# Patient Record
Sex: Female | Born: 1995 | Race: Black or African American | Hispanic: No | Marital: Single | Smoking: Never smoker
Health system: Southern US, Community
[De-identification: ages and names within clinical notes are randomized; demographics above are authoritative.]

## PROBLEM LIST (undated history)

## (undated) DIAGNOSIS — Z8041 Family history of malignant neoplasm of ovary: Secondary | ICD-10-CM

## (undated) DIAGNOSIS — Z803 Family history of malignant neoplasm of breast: Secondary | ICD-10-CM

## (undated) DIAGNOSIS — M329 Systemic lupus erythematosus, unspecified: Secondary | ICD-10-CM

## (undated) DIAGNOSIS — N63 Unspecified lump in unspecified breast: Secondary | ICD-10-CM

## (undated) DIAGNOSIS — D649 Anemia, unspecified: Secondary | ICD-10-CM

## (undated) DIAGNOSIS — Z8042 Family history of malignant neoplasm of prostate: Secondary | ICD-10-CM

## (undated) HISTORY — DX: Family history of malignant neoplasm of prostate: Z80.42

## (undated) HISTORY — DX: Family history of malignant neoplasm of ovary: Z80.41

## (undated) HISTORY — PX: NO PAST SURGERIES: SHX2092

## (undated) HISTORY — DX: Family history of malignant neoplasm of breast: Z80.3

---
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
## 2016-02-11 ENCOUNTER — Encounter (HOSPITAL_COMMUNITY): Payer: Self-pay

## 2016-02-11 ENCOUNTER — Inpatient Hospital Stay (HOSPITAL_COMMUNITY)
Admission: AD | Admit: 2016-02-11 | Discharge: 2016-02-11 | Disposition: A | Discharge status: Home / Self Care | Payer: Federal, State, Local not specified - PPO | Source: Ambulatory Visit | Attending: Family Medicine | Admitting: Family Medicine

## 2016-02-11 DIAGNOSIS — M329 Systemic lupus erythematosus, unspecified: Secondary | ICD-10-CM | POA: Diagnosis not present

## 2016-02-11 DIAGNOSIS — N939 Abnormal uterine and vaginal bleeding, unspecified: Secondary | ICD-10-CM | POA: Diagnosis not present

## 2016-02-11 DIAGNOSIS — D509 Iron deficiency anemia, unspecified: Secondary | ICD-10-CM | POA: Diagnosis not present

## 2016-02-11 HISTORY — DX: Anemia, unspecified: D64.9

## 2016-02-11 HISTORY — DX: Systemic lupus erythematosus, unspecified: M32.9

## 2016-02-11 LAB — URINALYSIS, ROUTINE W REFLEX MICROSCOPIC
BILIRUBIN URINE: NEGATIVE
Glucose, UA: NEGATIVE mg/dL
Ketones, ur: NEGATIVE mg/dL
LEUKOCYTES UA: NEGATIVE
NITRITE: NEGATIVE
PH: 7 (ref 5.0–8.0)
Protein, ur: 100 mg/dL — AB
SPECIFIC GRAVITY, URINE: 1.015 (ref 1.005–1.030)

## 2016-02-11 LAB — URINE MICROSCOPIC-ADD ON: WBC UA: NONE SEEN WBC/hpf (ref 0–5)

## 2016-02-11 LAB — CBC
HCT: 32.2 % — ABNORMAL LOW (ref 36.0–46.0)
Hemoglobin: 10.3 g/dL — ABNORMAL LOW (ref 12.0–15.0)
MCH: 24 pg — ABNORMAL LOW (ref 26.0–34.0)
MCHC: 32 g/dL (ref 30.0–36.0)
MCV: 74.9 fL — ABNORMAL LOW (ref 78.0–100.0)
PLATELETS: 144 10*3/uL — AB (ref 150–400)
RBC: 4.3 MIL/uL (ref 3.87–5.11)
RDW: 19.7 % — AB (ref 11.5–15.5)
WBC: 3.1 10*3/uL — AB (ref 4.0–10.5)

## 2016-02-11 LAB — POCT PREGNANCY, URINE: PREG TEST UR: NEGATIVE

## 2016-02-11 MED ORDER — MEDROXYPROGESTERONE ACETATE 10 MG PO TABS: 20.0000 mg | ORAL_TABLET | Freq: Every day | ORAL | Status: DC

## 2016-02-11 MED ORDER — HYDROXYCHLOROQUINE SULFATE 200 MG PO TABS: 300.0000 mg | ORAL_TABLET | Freq: Every day | ORAL | Status: DC

## 2016-02-11 MED ORDER — FERROUS SULFATE 325 (65 FE) MG PO TABS: 325.0000 mg | ORAL_TABLET | Freq: Two times a day (BID) | ORAL | Status: DC

## 2016-02-11 NOTE — Discharge Instructions (Signed)
Abnormal Uterine Bleeding °Abnormal uterine bleeding can affect women at various stages in life, including teenagers, women in their reproductive years, pregnant women, and women who have reached menopause. Several kinds of uterine bleeding are considered abnormal, including: °· Bleeding or spotting between periods.   °· Bleeding after sexual intercourse.   °· Bleeding that is heavier or more than normal.   °· Periods that last longer than usual. °· Bleeding after menopause.   °Many cases of abnormal uterine bleeding are minor and simple to treat, while others are more serious. Any type of abnormal bleeding should be evaluated by your health care provider. Treatment will depend on the cause of the bleeding. °HOME CARE INSTRUCTIONS °Monitor your condition for any changes. The following actions may help to alleviate any discomfort you are experiencing: °· Avoid the use of tampons and douches as directed by your health care provider. °· Change your pads frequently. °You should get regular pelvic exams and Pap tests. Keep all follow-up appointments for diagnostic tests as directed by your health care provider.  °SEEK MEDICAL CARE IF:  °· Your bleeding lasts more than 1 week.   °· You feel dizzy at times.   °SEEK IMMEDIATE MEDICAL CARE IF:  °· You pass out.   °· You are changing pads every 15 to 30 minutes.   °· You have abdominal pain. °· You have a fever.   °· You become sweaty or weak.   °· You are passing large blood clots from the vagina.   °· You start to feel nauseous and vomit. °MAKE SURE YOU:  °· Understand these instructions. °· Will watch your condition. °· Will get help right away if you are not doing well or get worse. °  °This information is not intended to replace advice given to you by your health care provider. Make sure you discuss any questions you have with your health care provider. °  °Document Released: 09/29/2005 Document Revised: 10/04/2013 Document Reviewed: 04/28/2013 °Elsevier Interactive  Patient Education ©2016 Elsevier Inc. °Anemia, Nonspecific °Anemia is a condition in which the concentration of red blood cells or hemoglobin in the blood is below normal. Hemoglobin is a substance in red blood cells that carries oxygen to the tissues of the body. Anemia results in not enough oxygen reaching these tissues.  °CAUSES  °Common causes of anemia include:  °· Excessive bleeding. Bleeding may be internal or external. This includes excessive bleeding from periods (in women) or from the intestine.   °· Poor nutrition.   °· Chronic kidney, thyroid, and liver disease.  °· Bone marrow disorders that decrease red blood cell production. °· Cancer and treatments for cancer. °· HIV, AIDS, and their treatments. °· Spleen problems that increase red blood cell destruction. °· Blood disorders. °· Excess destruction of red blood cells due to infection, medicines, and autoimmune disorders. °SIGNS AND SYMPTOMS  °· Minor weakness.   °· Dizziness.   °· Headache. °· Palpitations.   °· Shortness of breath, especially with exercise.   °· Paleness. °· Cold sensitivity. °· Indigestion. °· Nausea. °· Difficulty sleeping. °· Difficulty concentrating. °Symptoms may occur suddenly or they may develop slowly.  °DIAGNOSIS  °Additional blood tests are often needed. These help your health care provider determine the best treatment. Your health care provider will check your stool for blood and look for other causes of blood loss.  °TREATMENT  °Treatment varies depending on the cause of the anemia. Treatment can include:  °· Supplements of iron, vitamin B12, or folic acid.   °· Hormone medicines.   °· A blood transfusion. This may be needed if blood loss is   severe.   °· Hospitalization. This may be needed if there is significant continual blood loss.   °· Dietary changes. °· Spleen removal. °HOME CARE INSTRUCTIONS °Keep all follow-up appointments. It often takes many weeks to correct anemia, and having your health care provider check on  your condition and your response to treatment is very important. °SEEK IMMEDIATE MEDICAL CARE IF:  °· You develop extreme weakness, shortness of breath, or chest pain.   °· You become dizzy or have trouble concentrating. °· You develop heavy vaginal bleeding.   °· You develop a rash.   °· You have bloody or black, tarry stools.   °· You faint.   °· You vomit up blood.   °· You vomit repeatedly.   °· You have abdominal pain. °· You have a fever or persistent symptoms for more than 2-3 days.   °· You have a fever and your symptoms suddenly get worse.   °· You are dehydrated.   °MAKE SURE YOU: °· Understand these instructions. °· Will watch your condition. °· Will get help right away if you are not doing well or get worse. °  °This information is not intended to replace advice given to you by your health care provider. Make sure you discuss any questions you have with your health care provider. °  °Document Released: 11/06/2004 Document Revised: 06/01/2013 Document Reviewed: 03/25/2013 °Elsevier Interactive Patient Education ©2016 Elsevier Inc. ° °

## 2016-02-11 NOTE — MAU Provider Note (Signed)
History     CSN: [id number]  Arrival date and time: 02/11/16 1726   First Provider Initiated Contact with Patient 02/11/16 1926      Chief Complaint  Patient presents with  . Vaginal Bleeding   HPI   Ms.Martha Valentine is a 20 y.o. female G0P0000 with a history of anemia and lupus presents to MAU with concerns about irregular vaginal bleeding. She started her period on 4/6 which was a regular time for her menstrual cycle to start. She has not stopped bleeding since 4/6. The bleeding is described as heavy; it started out as light and picked up to heavy today.  No blood clots noted. She has changed her pad 2-3 times today.   She takes iron sporadically; cannot remember when the last time she had.   Patient is not sexually active currently . She has never had a sexual transmitted disease.   OB History    Gravida Para Term Preterm AB TAB SAB Ectopic Multiple Living   0 0 0 0 0 0 0 0 0 0       Past Medical History  Diagnosis Date  . Lupus (systemic lupus erythematosus) (HCC)   . Anemia     Past Surgical History  Procedure Laterality Date  . No past surgeries      No family history on file.  Social History  Substance Use Topics  . Smoking status: Never Smoker   . Smokeless tobacco: None  . Alcohol Use: No    Allergies: No Known Allergies  Prescriptions prior to admission  Medication Sig Dispense Refill Last Dose  . ferrous sulfate 325 (65 FE) MG tablet Take 325 mg by mouth daily with breakfast.   Past Month at Unknown time    Results for orders placed or performed during the hospital encounter of 02/11/16 (from the past 72 hour(s))  Urinalysis, Routine w reflex microscopic (not [facility])     Status: Abnormal   Collection Time: 02/11/16  6:09 PM  Result Value Ref Range   Color, Urine RED (A) YELLOW    Comment: BIOCHEMICALS MAY BE AFFECTED BY COLOR   APPearance CLEAR CLEAR   Specific Gravity, Urine 1.015 1.005 - 1.030   pH 7.0 5.0 - 8.0   Glucose, UA NEGATIVE  NEGATIVE mg/dL   Hgb urine dipstick LARGE (A) NEGATIVE   Bilirubin Urine NEGATIVE NEGATIVE   Ketones, ur NEGATIVE NEGATIVE mg/dL   Protein, ur 130100 (A) NEGATIVE mg/dL   Nitrite NEGATIVE NEGATIVE   Leukocytes, UA NEGATIVE NEGATIVE  Urine microscopic-add on     Status: Abnormal   Collection Time: 02/11/16  6:09 PM  Result Value Ref Range   Squamous Epithelial / LPF 6-30 (A) NONE SEEN   WBC, UA NONE SEEN 0 - 5 WBC/hpf   RBC / HPF TOO NUMEROUS TO COUNT 0 - 5 RBC/hpf   Bacteria, UA MANY (A) NONE SEEN  Pregnancy, urine POC     Status: None   Collection Time: 02/11/16  6:28 PM  Result Value Ref Range   Preg Test, Ur NEGATIVE NEGATIVE    Comment:        THE SENSITIVITY OF THIS METHODOLOGY IS >24 mIU/mL   CBC     Status: Abnormal   Collection Time: 02/11/16  7:06 PM  Result Value Ref Range   WBC 3.1 (L) 4.0 - 10.5 K/uL   RBC 4.30 3.87 - 5.11 MIL/uL   Hemoglobin 10.3 (L) 12.0 - 15.0 g/dL   HCT 86.532.2 (L) 78.436.0 -  46.0 %   MCV 74.9 (L) 78.0 - 100.0 fL   MCH 24.0 (L) 26.0 - 34.0 pg   MCHC 32.0 30.0 - 36.0 g/dL   RDW 09.8 (H) 11.9 - 14.7 %   Platelets 144 (L) 150 - 400 K/uL     Review of Systems  Gastrointestinal: Negative for abdominal pain.  Genitourinary: Negative for dysuria.  Neurological: Negative for dizziness.   Physical Exam   Blood pressure 134/80, pulse 93, temperature 98.2 F (36.8 C), resp. rate 18, height  (1.727 m), weight 123 lb (55.792 kg), last menstrual period 01/17/2016.  Physical Exam  Constitutional: She is oriented to person, place, and time. She appears well-developed and well-nourished. No distress.  HENT:  Head: Normocephalic.  Eyes: Pupils are equal, round, and reactive to light.  Respiratory: Effort normal.  GI: Soft.  Genitourinary:  Speculum exam deferred Cervix closed, no CMT  Uterus non tender, normal size Adnexa non tender, no masses bilaterally Chaperone present for exam.  Musculoskeletal: Normal range of motion.  Neurological: She is  alert and oriented to person, place, and time.  Skin: Skin is warm. She is not diaphoretic.  Psychiatric: Her behavior is normal.    MAU Course  Procedures  None  MDM CBC Antiphospholipid panel drawn  Discussed patient with Dr. Adrian Blackwater regarding hormonal therapy for bleeding.   Assessment and Plan   A:  1. Abnormal vaginal bleeding   2. Anemia, iron deficiency      P:  Discharge home in stable condition Follow up in the woc in 1-2 weeks for re-evaluation.  RX: Provera, Plaquenil, Iron.  Return to MAU if symptoms worsen  Bleeding precautions.    Duane Lope, NP 02/14/2016 9:04 AM

## 2016-02-11 NOTE — MAU Note (Signed)
Pt states that she started her period on 01/17/2016-has not stopped. Denies pain. States that she changes her pad 4-5 times/day on average. Today has changed her pad 2-3 times. States that this is unusual for her periods.

## 2016-02-11 NOTE — MAU Note (Signed)
States has had her period for 3 weeks. No pain. Denies hx of irreg periods or bleeding.

## 2016-02-14 LAB — ANTIPHOSPHOLIPID SYNDROME EVAL, BLD
Anticardiolipin IgG: <9 GPL U/mL (ref 0–14)
Anticardiolipin IgM: <9 MPL U/mL (ref 0–12)
DRVVT: 38.5 s (ref 0.0–47.0)
PTT Lupus Anticoagulant: 38.4 s (ref 0.0–43.6)
Phosphatydalserine, IgA: 2 APS IgA (ref 0–20)
Phosphatydalserine, IgG: 4 GPS IgG (ref 0–11)
Phosphatydalserine, IgM: 10 MPS IgM (ref 0–25)

## 2016-02-18 ENCOUNTER — Encounter: Payer: Federal, State, Local not specified - PPO | Admitting: Obstetrics & Gynecology

## 2016-12-17 ENCOUNTER — Other Ambulatory Visit: Payer: Self-pay | Admitting: Obstetrics and Gynecology

## 2016-12-17 DIAGNOSIS — N63 Unspecified lump in unspecified breast: Secondary | ICD-10-CM

## 2016-12-19 ENCOUNTER — Ambulatory Visit
Admission: RE | Admit: 2016-12-19 | Discharge: 2016-12-19 | Disposition: A | Discharge status: Home / Self Care | Payer: Federal, State, Local not specified - PPO | Source: Ambulatory Visit | Attending: Obstetrics and Gynecology | Admitting: Obstetrics and Gynecology

## 2016-12-19 DIAGNOSIS — N63 Unspecified lump in unspecified breast: Secondary | ICD-10-CM

## 2016-12-19 HISTORY — DX: Unspecified lump in unspecified breast: N63.0

## 2017-05-08 ENCOUNTER — Telehealth: Payer: Self-pay | Admitting: Genetics

## 2017-05-08 ENCOUNTER — Encounter: Payer: Self-pay | Admitting: Genetics

## 2017-05-08 NOTE — Telephone Encounter (Signed)
Genetic counseling appt has been scheduled for the pt to see Lillia AbedLindsay on 8/13 at 1pm. Pt aware to arrive 15 minutes early. Address and insurance verified. Letter mailed to the pt and faxed to the referring.

## 2017-05-25 ENCOUNTER — Ambulatory Visit (HOSPITAL_BASED_OUTPATIENT_CLINIC_OR_DEPARTMENT_OTHER): Payer: Federal, State, Local not specified - PPO | Admitting: Genetics

## 2017-05-25 ENCOUNTER — Other Ambulatory Visit: Payer: Federal, State, Local not specified - PPO

## 2017-05-25 ENCOUNTER — Encounter: Payer: Self-pay | Admitting: Genetics

## 2017-05-25 DIAGNOSIS — Z803 Family history of malignant neoplasm of breast: Secondary | ICD-10-CM

## 2017-05-25 DIAGNOSIS — Z8041 Family history of malignant neoplasm of ovary: Secondary | ICD-10-CM

## 2017-05-25 DIAGNOSIS — Z8042 Family history of malignant neoplasm of prostate: Secondary | ICD-10-CM

## 2017-05-25 NOTE — Progress Notes (Signed)
REFERRING PROVIDER: Tyson Dense, [address] [place], [place] [address]  PRIMARY PROVIDER:  Jerene Pitch, MD   PRIMARY REASON FOR VISIT:  1. Family history of breast cancer   2. Family history of ovarian cancer   3. Family history of prostate cancer     HISTORY OF PRESENT ILLNESS:   Martha Valentine, a 21 y.o. female, was seen for a [place] cancer genetics consultation at the request of Dr. Emeline Gins due to a family history of cancer.  Martha Valentine presents to clinic today to discuss the possibility of a hereditary predisposition to cancer, genetic testing, and to further clarify her future cancer risks, as well as potential cancer risks for family members.   Martha Valentine is a 21 y.o. female with no personal history of cancer.  She has nodule in her breast that is being followed.   HORMONAL RISK FACTORS:  Menarche was at age 89.  First live birth at age N/A.  Ovaries intact: yes.  Hysterectomy: no.  Menopausal status: premenopausal.  HRT use: 0 years. Colonoscopy: no; not examined. Number of breast biopsies: 0.  Past Medical History:  Diagnosis Date  . Anemia   . Breast mass 12/19/2016   Mass upper inner quad X 2 weeks   . Family history of breast cancer   . Family history of ovarian cancer   . Family history of prostate cancer   . Lupus (systemic lupus erythematosus) ([place])     Past Surgical History:  Procedure Laterality Date  . NO PAST SURGERIES      Social History   Social History  . Marital status: Single    Spouse name: N/A  . Number of children: N/A  . Years of education: N/A   Social History Main Topics  . Smoking status: Never Smoker  . Smokeless tobacco: None  . Alcohol use No  . Drug use: No  . Sexual activity: Not Asked   Other Topics Concern  . None   Social History Narrative  . None     FAMILY HISTORY:  We obtained a detailed, 4-generation family history.  Significant diagnoses are listed below: Family History    Problem Relation Age of Onset  . Breast cancer Mother 26  . Other Father 63       Brain Tumor  . Lung cancer Maternal Aunt 41  . Prostate cancer Maternal Grandfather 68       required radiation therapy  . Ovarian cancer Other 78  . Ovarian cancer Other 47   Martha Valentine has a half sister who is 46 with no history of cancer. This sister has 3 daughters and a son with no history of cancer.  Martha Valentine has a 46 year-old brother with no history of cancer or any children.  Martha Valentine has a 58 year-old brother who has 2 sons ages 17 and 70 with no history of cancer.  Martha Valentine has a 51 year-old brother who has a son and a daughter (ages 47 and 70) with no history of cancer.   Martha Valentine father is 44 and has had a benign brain tumor since his 25's.  Martha Valentine has 1 paternal aunt and 2 paternal half-uncles listed below: -1 paternal aunt is 38 and has no history of cancer.  She has 2 daughters and 2 sons who are in their 78's/40's with no known history of cancer.   -1 paternal half-uncle died in his 73's.  No other information is known  about him.  -1 paternal half-uncle is 79 and has no history of cancer.  He has 4 daughters, not much information is known about these relatives.   Martha Valentine paternal grandfather died in his 51's, no information is known about him or his family.  Martha Valentine paternal grandmother is 14 and has no history of cancer.  Martha Valentine mother was diagnosed with breast cancer at 2 and is currently 1.  She had genetic testing in 2016 for the [facility] Breast/Ovarian cancer panel (21 genes) that was negative.  Martha Valentine has 3 maternal uncles and 1 maternal aunt listed below: -1 maternal uncle is 24 with no history of cancer or any children.  -1 maternal uncle is 74 with no history of cancer or any children.  -1 maternal uncle is 13 and has 3 daughters and a son.  No history of cancer for these relatives.  -1 maternal aunt died at 79 due to lung cancer.  She had 2  daughtesr (ages 42 and 9) and a son (37) with no history of cancer.  Martha Valentine maternal grandfather is 24 and was diagnosed with prostate cancer in his late 38's.  He was treated with radiation.  This grandfather had a sister who died of ovarian cancer at 46. This great-aunt had a daughter who died from ovarian cancer at 64, a daughter who had breast cancer in her late 32's, and a son who died from prostate cancer at 50.  Martha Valentine maternal grandfather also had a sister who might have had cancer in her late 94's.  He has a brother who died in his late 61's.  No much information is known about these relatives.    Martha Valentine maternal grandmother is 17 with no known history of cancer. This grandmother had a sister who died from ovarian cancer in her 81's.  This grandmother had 3 other sisters and a brother who might have had cancer.  Limited information is known about Martha Valentine's maternal grandmother's family history.    Martha Valentine is unaware of previous family history of genetic testing for hereditary cancer risks. Patient's maternal ancestors are of African American/native American descent, and paternal ancestors are of African American/Native American descent. There is no reported Ashkenazi Jewish ancestry. There is no known consanguinity.  GENETIC COUNSELING ASSESSMENT: Martha Valentine is a 21 y.o. female with a family history which is somewhat suggestive of a Hereditary Cancer Predisposition Syndrome. We, therefore, discussed and recommended the following at today's visit.   DISCUSSION: We reviewed the characteristics, features and inheritance patterns of hereditary cancer syndromes. We also discussed genetic testing, including the appropriate family members to test, the process of testing, insurance coverage and turn-around-time for results. We discussed the implications of a negative, positive and/or variant of uncertain significant result.   We discussed that only 5-10% of cancers are  associated with a Hereditary cancer predisposition syndrome.  One of the most common hereditary cancer syndromes that increases breast and ovarian cancer risk is called Hereditary Breast and Ovarian Cancer (HBOC) syndrome.  This syndrome is caused by mutations in the BRCA1 and BRCA2 genes.  This syndrome increases an individual's lifetime risk to develop breast, ovarian, pancreatic, and other types of cancer.  There are also many other cancer predisposition syndromes caused by mutations in several other genes.  We discussed that if she is found to have a mutation in one of these genes, it may impact future medical management recommendations such as increased cancer screenings and consideration  of risk reducing surgeries.  A positive result could also have implications for the patient's family members.  A Negative result would mean we were unable to identify a hereditary component to her cancer, but does not rule out the possibility of a hereditary basis for her cancer.  There could be mutations that are undetectable by current technology, or in genes not yet tested or identified to increase cancer risk.    We discussed the potential to find a Variant of Uncertain Significance or VUS.  These are variants that have not yet been identified as pathogenic or benign, and it is unknown if this variant is associated with increased cancer risk or if this is a normal finding.  Most VUS's are reclassified to benign or likely benign.   It should not be used to make medical management decisions. With time, we suspect the lab will determine the significance of any VUS's identified if any.   Because Ms. Mossa's mother already had genetic testing that was negative, we did not recommend genetic testing for Ms. Nelon at this time.  We discussed that insurance may not cover the test because her mother has already tested negative, but that if she wished to pursue genetic testing the patient pay price would be $250.  Marland Kitchen     We discussed that while her family history is suggestive of a genetic predisposition to cancer, if genetic testing did not reveal a mutation in her mother, it would be very unlikely for genetic testing to find a mutation that explains the family history of cancer in her.  We recommended that Ms. Beidler's maternal grandmother and maternal grandfather have genetic counseling and genetic testing.  Additionally, Ms. Hawkey maternal cousins once removed and 2nd cousins (related to the great aunt and cousins once removed with ovarian cancer) are all recommended to have genetic testing.  Ms. Kozub will let us know if we can be of assistance in coordinating this testing.    Based on the patient's personal and family history and the statistical model Lilian Kapur,  was used to estimate her risk of developing breast cacner. These estimate her lifetime risk of developing breast cancer to be approximately 25.6%%. The patient's lifetime breast cancer risk is a preliminary estimate based on available information using one of several models endorsed by the [employer] (ACS). The ACS recommends consideration of breast MRI screening as an adjunct to mammography for patients at high risk (defined as 20% or greater lifetime risk). A more detailed breast cancer risk assessment can be considered, if clinically indicated.   Ms. Allender has been determined to be at high risk for breast cancer.  Therefore, we recommend that annual screening with mammography and breast MRI begin at age 70, or 10 years prior to the age of breast cancer diagnosis in a relative (whichever is earlier).  We discussed that Ms. Flansburg should discuss her individual situation with her referring physician and determine a breast cancer screening plan with which they are both comfortable.      We discussed that some people do not want to undergo genetic testing due to fear of genetic discrimination.  A federal law called the Genetic  Information Non-Discrimination Act (GINA) of 2008 helps protect individuals against genetic discrimination based on their genetic test results.  It impacts both health insurance and employment.  With health insurance, it protects against increased premiums, being kicked off insurance or being forced to take a test in order to be insured.  For employment  it protects against hiring, firing and promoting decisions based on genetic test results.  Health status due to a cancer diagnosis is not protected under GINA.We discussed that some people do not want to undergo genetic testing due to fear of genetic discrimination.  A federal law called the Genetic Information Non-Discrimination Act (GINA) of 2008 helps protect individuals against genetic discrimination based on their genetic test results.  It impacts both health insurance and employment.  With health insurance, it protects against increased premiums, being kicked off insurance or being forced to take a test in order to be insured.  For employment it protects against hiring, firing and promoting decisions based on genetic test results.  Health status due to a cancer diagnosis is not protected under GINA.  PLAN: After considering the risks, benefits, and limitations, Ms. Haymaker has decided to decline testing at this time.  She may discuss genetic testing with her maternal grandparents and other maternal relatives first.  These relatives genetic test results will be more informative in assessing the family history of cancer.  Ms. Roam will let us know if we can be of help in coordinating this testing.   We encouraged Ms. Michel to remain in contact with cancer genetics annually so that we can continuously update the family history and inform her of any changes in cancer genetics and testing that may be of benefit for her family. Ms. Mom questions were answered to her satisfaction today. Our contact information was provided should additional questions or  concerns arise. We; therefore, recommend Ms. Gwynn continue to follow the cancer screening guidelines given by her primary and other healthcare providers.  Based on Ms. Freese's family history, we recommended her maternal relatives, who are related to her maternal great aunts with ovarian cancer, have genetic counseling and testing. Ms. Zertuche will let us know if we can be of any assistance in coordinating genetic counseling and/or testing for this family member.   Lastly, we encouraged Ms. Lada to remain in contact with cancer genetics annually so that we can continuously update the family history and inform her of any changes in cancer genetics and testing that may be of benefit for this family.   Ms.  Parlin questions were answered to her satisfaction today. Our contact information was provided should additional questions or concerns arise. Thank you for the referral and allowing [place] to share in the care of your patient.   Tana Felts, MS Genetic Counselor [email or web address] phone: [phone]  The patient was seen for a total of 60 minutes in face-to-face genetic counseling.  The patient was accompanied today by her mother, Corliss Skains, and her 2 nephews.

## 2017-06-15 ENCOUNTER — Encounter (HOSPITAL_COMMUNITY): Payer: Self-pay | Admitting: *Deleted

## 2017-06-15 ENCOUNTER — Emergency Department (HOSPITAL_COMMUNITY)
Admission: EM | Admit: 2017-06-15 | Discharge: 2017-06-15 | Disposition: A | Discharge status: Home / Self Care | Payer: Federal, State, Local not specified - PPO | Attending: Emergency Medicine | Admitting: Emergency Medicine

## 2017-06-15 DIAGNOSIS — Z79899 Other long term (current) drug therapy: Secondary | ICD-10-CM | POA: Insufficient documentation

## 2017-06-15 DIAGNOSIS — R55 Syncope and collapse: Secondary | ICD-10-CM | POA: Diagnosis present

## 2017-06-15 DIAGNOSIS — D509 Iron deficiency anemia, unspecified: Secondary | ICD-10-CM | POA: Diagnosis not present

## 2017-06-15 LAB — CBG MONITORING, ED: GLUCOSE-CAPILLARY: 88 mg/dL (ref 65–99)

## 2017-06-15 LAB — BASIC METABOLIC PANEL
Anion gap: 5 (ref 5–15)
BUN: 9 mg/dL (ref 6–20)
CHLORIDE: 106 mmol/L (ref 101–111)
CO2: 27 mmol/L (ref 22–32)
CREATININE: 0.72 mg/dL (ref 0.44–1.00)
Calcium: 9.5 mg/dL (ref 8.9–10.3)
GFR calc Af Amer: >60 mL/min (ref >60)
GFR calc non Af Amer: >60 mL/min (ref >60)
GLUCOSE: 95 mg/dL (ref 65–99)
Potassium: 4.2 mmol/L (ref 3.5–5.1)
Sodium: 138 mmol/L (ref 135–145)

## 2017-06-15 LAB — URINALYSIS, ROUTINE W REFLEX MICROSCOPIC
BILIRUBIN URINE: NEGATIVE
GLUCOSE, UA: NEGATIVE mg/dL
HGB URINE DIPSTICK: NEGATIVE
KETONES UR: NEGATIVE mg/dL
Nitrite: NEGATIVE
PROTEIN: NEGATIVE mg/dL
Specific Gravity, Urine: 1.01 (ref 1.005–1.030)
pH: 8 (ref 5.0–8.0)

## 2017-06-15 LAB — CBC
HCT: 30.2 % — ABNORMAL LOW (ref 36.0–46.0)
Hemoglobin: 9.4 g/dL — ABNORMAL LOW (ref 12.0–15.0)
MCH: 22.6 pg — AB (ref 26.0–34.0)
MCHC: 31.1 g/dL (ref 30.0–36.0)
MCV: 72.6 fL — AB (ref 78.0–100.0)
PLATELETS: 199 10*3/uL (ref 150–400)
RBC: 4.16 MIL/uL (ref 3.87–5.11)
RDW: 19.2 % — AB (ref 11.5–15.5)
WBC: 5 10*3/uL (ref 4.0–10.5)

## 2017-06-15 LAB — I-STAT BETA HCG BLOOD, ED (MC, WL, AP ONLY)

## 2017-06-15 MED ORDER — SODIUM CHLORIDE 0.9 % IV BOLUS (SEPSIS)
1000.0000 mL | Freq: Once | INTRAVENOUS | Status: AC
  Administered 2017-06-15: 1000 mL via INTRAVENOUS

## 2017-06-15 MED ORDER — FLUCONAZOLE 200 MG PO TABS
200.0000 mg | ORAL_TABLET | Freq: Once | ORAL | Status: AC
  Administered 2017-06-15: 200 mg via ORAL
  Filled 2017-06-15: qty 1

## 2017-06-15 NOTE — ED Triage Notes (Signed)
Pt states she was a little upset, lying down then felt like she could not breath, friend with her. Pt with only c/o headache in triage, no SHOB or other symptoms noted

## 2017-06-15 NOTE — ED Triage Notes (Signed)
Pt BIB [facility] after having a near syncopal episode today. Hx of lupus. Alert and oriented. VSS.

## 2017-06-15 NOTE — ED Provider Notes (Addendum)
[facility] Provider Note   CSN: [id number] Arrival date & time: 06/15/17  1717     History   Chief Complaint Chief Complaint  Patient presents with  . Near Syncope    HPI Martha Valentine is a 21 y.o. female.  Patient is a 21 year old female with a history of recently diagnosed lupus who presents with a near syncopal episode.she states that she was lying on the bed and had a sudden onset of dizziness with some shortness of breath and palpitations. She felt like she was going to pass out but did not pass out. She's had no recent illnesses. No fevers coughing or cold symptoms. No urinary symptoms. She currently feels fatigued and has a bifrontal headache but denies any ongoing symptoms. She's had similar symptoms 4-5 times in the past. She's had an extensive evaluation which was when she was diagnosed with the lupus. She has been seen by cardiology and had a Holter monitor placed which she says was unremarkable.      Past Medical History:  Diagnosis Date  . Anemia   . Breast mass 12/19/2016   Mass upper inner quad X 2 weeks   . Family history of breast cancer   . Family history of ovarian cancer   . Family history of prostate cancer   . Lupus (systemic lupus erythematosus) (HCC)     Patient Active Problem List   Diagnosis Date Noted  . Family history of breast cancer 05/25/2017  . Family history of ovarian cancer 05/25/2017  . Family history of prostate cancer 05/25/2017    Past Surgical History:  Procedure Laterality Date  . NO PAST SURGERIES      OB History    Gravida Para Term Preterm AB Living   0 0 0 0 0 0   SAB TAB Ectopic Multiple Live Births   0 0 0 0         Home Medications    Prior to Admission medications   Medication Sig Start Date End Date Taking? Authorizing Provider  hydroxychloroquine (PLAQUENIL) 200 MG tablet Take 1.5 tablets (300 mg total) by mouth daily. Patient taking differently: Take 200 mg by mouth daily.  02/11/16  Yes Rasch,  Harolyn Rutherford, NP  ibuprofen (ADVIL,MOTRIN) 200 MG tablet Take 200 mg by mouth every 6 (six) hours as needed for fever, headache, mild pain, moderate pain or cramping.   Yes [provider]  ferrous sulfate 325 (65 FE) MG tablet Take 1 tablet (325 mg total) by mouth 2 (two) times daily with a meal. Patient not taking: Reported on 06/15/2017 02/11/16   Rasch, Harolyn Rutherford, NP    Family History Family History  Problem Relation Age of Onset  . Breast cancer Mother 102  . Other Father 15       Brain Tumor  . Lung cancer Maternal Aunt 65  . Prostate cancer Maternal Grandfather 68       required radiation therapy  . Ovarian cancer Other 85  . Ovarian cancer Other 19    Social History Social History  Substance Use Topics  . Smoking status: Never Smoker  . Smokeless tobacco: Never Used  . Alcohol use No     Allergies   Patient has no known allergies.   Review of Systems Review of Systems  Constitutional: Positive for fatigue. Negative for chills, diaphoresis and fever.  HENT: Negative for congestion, rhinorrhea and sneezing.   Eyes: Negative.   Respiratory: Positive for shortness of breath. Negative for cough and  chest tightness.   Cardiovascular: Positive for palpitations. Negative for chest pain and leg swelling.  Gastrointestinal: Negative for abdominal pain, blood in stool, diarrhea, nausea and vomiting.  Genitourinary: Negative for difficulty urinating, flank pain, frequency and hematuria.  Musculoskeletal: Negative for arthralgias and back pain.  Skin: Negative for rash.  Neurological: Positive for light-headedness and headaches. Negative for dizziness, speech difficulty, weakness and numbness.     Physical Exam Updated Vital Signs BP 115/81 (BP Location: Left Arm)   Pulse 74   Temp 98.5 F (36.9 C) (Oral)   Resp 18   Ht 5\' 8"  (1.727 m)   Wt 52.6 kg (116 lb)   SpO2 100%   BMI 17.64 kg/m   Physical Exam  Constitutional: She is oriented to person, place, and  time. She appears well-developed and well-nourished.  HENT:  Head: Normocephalic and atraumatic.  Eyes: Pupils are equal, round, and reactive to light.  Neck: Normal range of motion. Neck supple.  Cardiovascular: Normal rate, regular rhythm and normal heart sounds.   Pulmonary/Chest: Effort normal and breath sounds normal. No respiratory distress. She has no wheezes. She has no rales. She exhibits no tenderness.  Abdominal: Soft. Bowel sounds are normal. There is no tenderness. There is no rebound and no guarding.  Musculoskeletal: Normal range of motion. She exhibits no edema.  Lymphadenopathy:    She has no cervical adenopathy.  Neurological: She is alert and oriented to person, place, and time.  Motor 5/5 all extremities Sensation grossly intact to LT all extremities Finger to Nose intact, no pronator drift CN II-XII grossly intact    Skin: Skin is warm and dry. No rash noted.  Psychiatric: She has a normal mood and affect.     ED Treatments / Results  Labs (all labs ordered are listed, but only abnormal results are displayed) Labs Reviewed  CBC - Abnormal; Notable for the following:       Result Value   Hemoglobin 9.4 (*)    HCT 30.2 (*)    MCV 72.6 (*)    MCH 22.6 (*)    RDW 19.2 (*)    All other components within normal limits  URINALYSIS, ROUTINE W REFLEX MICROSCOPIC - Abnormal; Notable for the following:    APPearance HAZY (*)    Leukocytes, UA LARGE (*)    Bacteria, UA MANY (*)    Squamous Epithelial / LPF 6-30 (*)    All other components within normal limits  BASIC METABOLIC PANEL  CBG MONITORING, ED  I-STAT BETA HCG BLOOD, ED ([facility], [facility], [facility] ONLY)    EKG  EKG Interpretation  Date/Time:  Monday June 15 2017 17:57:12 EDT Ventricular Rate:  90 PR Interval:    QRS Duration: 77 QT Interval:  362 QTC Calculation: 443 R Axis:   82 Text Interpretation:  Sinus rhythm RSR' in V1 or V2, probably normal variant No old tracing to compare Confirmed by Rolan Bucco [phone]) on 06/15/2017 7:32:15 PM       Radiology No results found.  Procedures Procedures (including critical care time)  Medications Ordered in ED Medications  sodium chloride 0.9 % bolus 1,000 mL (0 mLs Intravenous Stopped 06/15/17 2136)     Initial Impression / Assessment and Plan / ED Course  I have reviewed the triage vital signs and the nursing notes.  Pertinent labs & imaging results that were available during my care of the patient were reviewed by me and considered in my medical decision making (see chart for details).  Patient is a 21 year old female who presents with a near syncopal episode. She's had similar episodes in the past. She does have some anemia and does report a history of anemia related to heavy menstrual cycles. I did encourage her to restart her iron supplements which she had previously been taking. She's currently not symptomatic for the anemia. She has no ongoing dizziness shortness of breath or other symptoms. She's had these similar type near syncopal episode in the past. She's had an extensive evaluation for same in the past. She's completely asymptomatic now. She was discharged home in good condition. She was encouraged to follow-up with her PCP. Return precautions were given.  Patient had some signs of a UTI and her urinalysis but appears to be more of a dirty specimen. She is not symptomatic for UTI so was not treated.  Final Clinical Impressions(s) / ED Diagnoses   Final diagnoses:  Near syncope  Iron deficiency anemia, unspecified iron deficiency anemia type    New Prescriptions New Prescriptions   No medications on file     Rolan BuccoBelfi, Aftin Lye, MD 06/15/17 2200    Rolan BuccoBelfi, Winola Drum, MD 06/15/17 2214

## 2017-11-10 ENCOUNTER — Emergency Department (HOSPITAL_COMMUNITY)
Admission: EM | Admit: 2017-11-10 | Discharge: 2017-11-10 | Disposition: A | Discharge status: Home / Self Care | Payer: Federal, State, Local not specified - PPO | Attending: Emergency Medicine | Admitting: Emergency Medicine

## 2017-11-10 ENCOUNTER — Emergency Department (HOSPITAL_COMMUNITY): Payer: Federal, State, Local not specified - PPO

## 2017-11-10 ENCOUNTER — Other Ambulatory Visit: Payer: Self-pay

## 2017-11-10 ENCOUNTER — Encounter (HOSPITAL_COMMUNITY): Payer: Self-pay | Admitting: Emergency Medicine

## 2017-11-10 DIAGNOSIS — Y999 Unspecified external cause status: Secondary | ICD-10-CM | POA: Insufficient documentation

## 2017-11-10 DIAGNOSIS — S60511A Abrasion of right hand, initial encounter: Secondary | ICD-10-CM | POA: Diagnosis not present

## 2017-11-10 DIAGNOSIS — Y9389 Activity, other specified: Secondary | ICD-10-CM | POA: Diagnosis not present

## 2017-11-10 DIAGNOSIS — S50312A Abrasion of left elbow, initial encounter: Secondary | ICD-10-CM | POA: Insufficient documentation

## 2017-11-10 DIAGNOSIS — Z79899 Other long term (current) drug therapy: Secondary | ICD-10-CM | POA: Diagnosis not present

## 2017-11-10 DIAGNOSIS — M25512 Pain in left shoulder: Secondary | ICD-10-CM | POA: Diagnosis not present

## 2017-11-10 DIAGNOSIS — T07XXXA Unspecified multiple injuries, initial encounter: Secondary | ICD-10-CM

## 2017-11-10 DIAGNOSIS — S6991XA Unspecified injury of right wrist, hand and finger(s), initial encounter: Secondary | ICD-10-CM | POA: Diagnosis present

## 2017-11-10 DIAGNOSIS — Y929 Unspecified place or not applicable: Secondary | ICD-10-CM | POA: Diagnosis not present

## 2017-11-10 DIAGNOSIS — M25551 Pain in right hip: Secondary | ICD-10-CM | POA: Diagnosis not present

## 2017-11-10 MED ORDER — IBUPROFEN 800 MG PO TABS
800.0000 mg | ORAL_TABLET | Freq: Once | ORAL | Status: AC
  Administered 2017-11-10: 800 mg via ORAL
  Filled 2017-11-10: qty 1

## 2017-11-10 MED ORDER — ACETAMINOPHEN 500 MG PO TABS
1000.0000 mg | ORAL_TABLET | Freq: Once | ORAL | Status: AC
Start: 2017-11-10 — End: 2017-11-10
  Administered 2017-11-10: 1000 mg via ORAL
  Filled 2017-11-10: qty 2

## 2017-11-10 NOTE — ED Notes (Signed)
Patient transported to X-ray 

## 2017-11-10 NOTE — ED Notes (Signed)
[employer] at bedside 

## 2017-11-10 NOTE — ED Notes (Signed)
Pt departed in NAD, refused use of wheelchair.  

## 2017-11-10 NOTE — Discharge Instructions (Signed)
You may alternate Tylenol 1000 mg every 6 hours as needed for pain and Ibuprofen 800 mg every 8 hours as needed for pain.  Please take Ibuprofen with food. ° °

## 2017-11-10 NOTE — ED Notes (Signed)
Pt states she is unable to urinate at this time. Continues to deny pregnancy and states is willing to sign waiver for xray.
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
## 2018-04-17 ENCOUNTER — Other Ambulatory Visit: Payer: Self-pay

## 2018-04-17 ENCOUNTER — Ambulatory Visit (HOSPITAL_COMMUNITY)
Admission: EM | Admit: 2018-04-17 | Discharge: 2018-04-17 | Disposition: A | Discharge status: Home / Self Care | Payer: Federal, State, Local not specified - PPO | Attending: Physician Assistant | Admitting: Physician Assistant

## 2018-04-17 ENCOUNTER — Encounter (HOSPITAL_COMMUNITY): Payer: Self-pay | Admitting: Emergency Medicine

## 2018-04-17 DIAGNOSIS — R197 Diarrhea, unspecified: Secondary | ICD-10-CM | POA: Diagnosis not present

## 2018-04-17 DIAGNOSIS — R112 Nausea with vomiting, unspecified: Secondary | ICD-10-CM | POA: Diagnosis not present

## 2018-04-17 MED ORDER — ONDANSETRON 4 MG PO TBDP
ORAL_TABLET | ORAL | Status: AC
  Filled 2018-04-17: qty 1

## 2018-04-17 MED ORDER — ONDANSETRON 4 MG PO TBDP
4.0000 mg | ORAL_TABLET | Freq: Once | ORAL | Status: AC
  Administered 2018-04-17: 4 mg via ORAL

## 2018-04-17 MED ORDER — ONDANSETRON 4 MG PO TBDP: 4.0000 mg | ORAL_TABLET | Freq: Three times a day (TID) | ORAL | 0 refills | Status: DC | PRN

## 2018-04-17 NOTE — ED Triage Notes (Signed)
[facility] a [facility] with N/V/D x 3 days.

## 2018-04-17 NOTE — Discharge Instructions (Signed)
Return if any problems.

## 2018-04-17 NOTE — ED Provider Notes (Signed)
[facility]    CSN: [id number] Arrival date & time: 04/17/18  1443     History   Chief Complaint Chief Complaint  Patient presents with  . Abdominal Pain    HPI Martha Valentine is a 22 y.o. female.   The history is provided by the patient. No language interpreter was used.  Abdominal Pain  Pain location:  Generalized Pain quality: aching   Pain severity:  Moderate Onset quality:  Gradual Duration:  1 day Timing:  Constant Progression:  Worsening Chronicity:  New Relieved by:  Nothing Worsened by:  Nothing Ineffective treatments:  None tried Associated symptoms: diarrhea and vomiting   Pt reports no uti symptoms on menses now.   Past Medical History:  Diagnosis Date  . Anemia   . Breast mass 12/19/2016   Mass upper inner quad X 2 weeks   . Family history of breast cancer   . Family history of ovarian cancer   . Family history of prostate cancer   . Lupus (systemic lupus erythematosus) (HCC)     Patient Active Problem List   Diagnosis Date Noted  . Family history of breast cancer 05/25/2017  . Family history of ovarian cancer 05/25/2017  . Family history of prostate cancer 05/25/2017    Past Surgical History:  Procedure Laterality Date  . NO PAST SURGERIES      OB History    Gravida  0   Para  0   Term  0   Preterm  0   AB  0   Living  0     SAB  0   TAB  0   Ectopic  0   Multiple  0   Live Births               Home Medications    Prior to Admission medications   Medication Sig Start Date End Date Taking? Authorizing Provider  hydroxychloroquine (PLAQUENIL) 200 MG tablet Take 1.5 tablets (300 mg total) by mouth daily. Patient not taking: Reported on 11/10/2017 02/11/16   Rasch, Victorino DikeJennifer I, NP  ondansetron (ZOFRAN ODT) 4 MG disintegrating tablet Take 1 tablet (4 mg total) by mouth every 8 (eight) hours as needed for nausea or vomiting. 04/17/18   Elson AreasSofia, Mariyanna Mucha K, PA-C    Family History Family History  Problem Relation  Age of Onset  . Breast cancer Mother 8652  . Other Father 1945       Brain Tumor  . Lung cancer Maternal Aunt 65  . Prostate cancer Maternal Grandfather 68       required radiation therapy  . Ovarian cancer Other 85  . Ovarian cancer Other 5045    Social History Social History   Tobacco Use  . Smoking status: Never Smoker  . Smokeless tobacco: Never Used  Substance Use Topics  . Alcohol use: No  . Drug use: Yes    Types: Marijuana     Allergies   Patient has no known allergies.   Review of Systems Review of Systems  Gastrointestinal: Positive for abdominal pain, diarrhea and vomiting.  All other systems reviewed and are negative.    Physical Exam Triage Vital Signs ED Triage Vitals [04/17/18 1501]  Enc Vitals Group     BP 103/74     Pulse Rate (!) 110     Resp 16     Temp 98.2 F (36.8 C)     Temp Source Oral     SpO2 100 %  Weight      Height      Head Circumference      Peak Flow      Pain Score 5     Pain Loc      Pain Edu?      Excl. in GC?    No data found.  Updated Vital Signs BP 103/74 (BP Location: Left Arm)   Pulse (!) 110   Temp 98.2 F (36.8 C) (Oral)   Resp 16   LMP 04/17/2018   SpO2 100%   Visual Acuity Right Eye Distance:   Left Eye Distance:   Bilateral Distance:    Right Eye Near:   Left Eye Near:    Bilateral Near:     Physical Exam  Constitutional: She is oriented to person, place, and time. She appears well-developed and well-nourished.  HENT:  Head: Normocephalic.  Mouth/Throat: Oropharynx is clear and moist.  Eyes: Pupils are equal, round, and reactive to light. EOM are normal.  Neck: Normal range of motion.  Pulmonary/Chest: Effort normal.  Abdominal: Soft. Normal appearance and bowel sounds are normal. She exhibits no distension.  Musculoskeletal: Normal range of motion.  Neurological: She is alert and oriented to person, place, and time.  Skin: Skin is warm.  Psychiatric: She has a normal mood and affect.    Nursing note and vitals reviewed.    UC Treatments / Results  Labs (all labs ordered are listed, but only abnormal results are displayed) Labs Reviewed - No data to display  EKG None  Radiology No results found.  Procedures Procedures (including critical care time)  Medications Ordered in UC Medications  ondansetron (ZOFRAN-ODT) disintegrating tablet 4 mg (4 mg Oral Given 04/17/18 1641)    Initial Impression / Assessment and Plan / UC Course  I have reviewed the triage vital signs and the nursing notes.  Pertinent labs & imaging results that were available during my care of the patient were reviewed by me and considered in my medical decision making (see chart for details).     Pt given zofran odt.  Pt able to drink without vomiting.  Pt reports she feels better Final Clinical Impressions(s) / UC Diagnoses   Final diagnoses:  Nausea vomiting and diarrhea     Discharge Instructions     Return if any problems.    ED Prescriptions    Medication Sig Dispense Auth. Provider   ondansetron (ZOFRAN ODT) 4 MG disintegrating tablet Take 1 tablet (4 mg total) by mouth every 8 (eight) hours as needed for nausea or vomiting. 20 tablet Elson Areas, [place]     Controlled Substance Prescriptions [place] Controlled Substance Registry consulted? Not Applicable   Elson Areas, [place] 04/17/18 1712

## 2018-04-26 NOTE — ED Notes (Signed)
04/26/2018,  Pt. Stopped at [facility] and requested a Work note. (She lost her original one.)  Work note given.

## 2018-10-10 ENCOUNTER — Emergency Department
Admission: EM | Admit: 2018-10-10 | Discharge: 2018-10-10 | Disposition: A | Discharge status: Home / Self Care | Payer: Federal, State, Local not specified - PPO | Attending: Emergency Medicine | Admitting: Emergency Medicine

## 2018-10-10 ENCOUNTER — Other Ambulatory Visit: Payer: Self-pay

## 2018-10-10 DIAGNOSIS — Y999 Unspecified external cause status: Secondary | ICD-10-CM | POA: Insufficient documentation

## 2018-10-10 DIAGNOSIS — Y929 Unspecified place or not applicable: Secondary | ICD-10-CM | POA: Insufficient documentation

## 2018-10-10 DIAGNOSIS — Z79899 Other long term (current) drug therapy: Secondary | ICD-10-CM | POA: Diagnosis not present

## 2018-10-10 DIAGNOSIS — S6992XA Unspecified injury of left wrist, hand and finger(s), initial encounter: Secondary | ICD-10-CM | POA: Diagnosis present

## 2018-10-10 DIAGNOSIS — W260XXA Contact with knife, initial encounter: Secondary | ICD-10-CM | POA: Diagnosis not present

## 2018-10-10 DIAGNOSIS — Y9389 Activity, other specified: Secondary | ICD-10-CM | POA: Insufficient documentation

## 2018-10-10 DIAGNOSIS — S61412A Laceration without foreign body of left hand, initial encounter: Secondary | ICD-10-CM

## 2018-10-10 MED ORDER — BACITRACIN ZINC 500 UNIT/GM EX OINT
TOPICAL_OINTMENT | Freq: Once | CUTANEOUS | Status: AC
  Administered 2018-10-10: 1 via TOPICAL

## 2018-10-10 NOTE — ED Triage Notes (Signed)
Reports she was opening a box and accidentally cut her self.  Patient with laceration at base of left thumb, no active bleeding at this time.

## 2018-10-10 NOTE — ED Provider Notes (Signed)
[facility] Emergency Department Provider Note   ____________________________________________   First MD Initiated Contact with Patient 10/10/18 [phone]     (approximate)  I have reviewed the triage vital signs and the nursing notes.   HISTORY  Chief Complaint Laceration    HPI Martha Valentine is a 22 y.o. female who presents to the ED from home with a chief complaint of hand laceration.  Patient was opening a box with a box cutter and accidentally cut her left hand.  She is right-hand dominant.  Incident occurred around 10 PM.  Tetanus is up-to-date.  Other than pain from the laceration, patient voices no further complaints or injuries.   Past Medical History:  Diagnosis Date  . Anemia   . Breast mass 12/19/2016   Mass upper inner quad X 2 weeks   . Family history of breast cancer   . Family history of ovarian cancer   . Family history of prostate cancer   . Lupus (systemic lupus erythematosus) (HCC)     Patient Active Problem List   Diagnosis Date Noted  . Family history of breast cancer 05/25/2017  . Family history of ovarian cancer 05/25/2017  . Family history of prostate cancer 05/25/2017    Past Surgical History:  Procedure Laterality Date  . NO PAST SURGERIES      Prior to Admission medications   Medication Sig Start Date End Date Taking? Authorizing Provider  hydroxychloroquine (PLAQUENIL) 200 MG tablet Take 1.5 tablets (300 mg total) by mouth daily. Patient not taking: Reported on 11/10/2017 02/11/16   Rasch, Victorino DikeJennifer I, NP  ondansetron (ZOFRAN ODT) 4 MG disintegrating tablet Take 1 tablet (4 mg total) by mouth every 8 (eight) hours as needed for nausea or vomiting. 04/17/18   Elson AreasSofia, Leslie K, PA-C    Allergies Patient has no known allergies.  Family History  Problem Relation Age of Onset  . Breast cancer Mother 3252  . Other Father 5845       Brain Tumor  . Lung cancer Maternal Aunt 65  . Prostate cancer Maternal Grandfather 68   required radiation therapy  . Ovarian cancer Other 85  . Ovarian cancer Other 2045    Social History Social History   Tobacco Use  . Smoking status: Never Smoker  . Smokeless tobacco: Never Used  Substance Use Topics  . Alcohol use: No  . Drug use: Yes    Types: Marijuana    Review of Systems  Constitutional: No fever/chills Eyes: No visual changes. ENT: No sore throat. Cardiovascular: Denies chest pain. Respiratory: Denies shortness of breath. Gastrointestinal: No abdominal pain.  No nausea, no vomiting.  No diarrhea.  No constipation. Genitourinary: Negative for dysuria. Musculoskeletal: Positive for hand laceration.  Negative for back pain. Skin: Negative for rash. Neurological: Negative for headaches, focal weakness or numbness.   ____________________________________________   PHYSICAL EXAM:  VITAL SIGNS: ED Triage Vitals  Enc Vitals Group     BP 10/10/18 0027 112/68     Pulse Rate 10/10/18 0027 63     Resp 10/10/18 0027 18     Temp 10/10/18 0027 97.8 F (36.6 C)     Temp Source 10/10/18 0027 Oral     SpO2 10/10/18 0027 99 %     Weight 10/10/18 0026 125 lb (56.7 kg)     Height 10/10/18 0026 5\' 8"  (1.727 m)     Head Circumference --      Peak Flow --      Pain  Score 10/10/18 0026 0     Pain Loc --      Pain Edu? --      Excl. in GC? --     Constitutional: Asleep, awakened for exam.  Alert and oriented. Well appearing and in no acute distress. Eyes: Conjunctivae are normal. PERRL. EOMI. Head: Atraumatic. Nose: No congestion/rhinnorhea. Mouth/Throat: Mucous membranes are moist.  Oropharynx non-erythematous. Neck: No stridor.   Cardiovascular: Normal rate, regular rhythm. Grossly normal heart sounds.  Good peripheral circulation. Respiratory: Normal respiratory effort.  No retractions. Lungs CTAB. Gastrointestinal: Soft and nontender. No distention. No abdominal bruits. No CVA tenderness. Musculoskeletal:  Left hand: Approximately 0.75 cm superficial  laceration to palmar surface of hand at base of thumb/thenar eminence.  Mild swelling noted.  No active bleeding.  Skin edges are well approximated.  2+ radial pulses.  Brisk, less than 5-second capillary refill. No lower extremity tenderness nor edema.  No joint effusions. Neurologic:  Normal speech and language. No gross focal neurologic deficits are appreciated. No gait instability. Skin:  Skin is warm, dry and intact. No rash noted. Psychiatric: Mood and affect are normal. Speech and behavior are normal.  ____________________________________________   LABS (all labs ordered are listed, but only abnormal results are displayed)  Labs Reviewed - No data to display ____________________________________________  EKG  None ____________________________________________  RADIOLOGY  ED MD interpretation: None  Official radiology report(s): No results found.  ____________________________________________   PROCEDURES  Procedure(s) performed: None  Procedures  Critical Care performed: No  ____________________________________________   INITIAL IMPRESSION / ASSESSMENT AND PLAN / ED COURSE  As part of my medical decision making, I reviewed the following data within the electronic MEDICAL RECORD NUMBER Nursing notes reviewed and incorporated and Notes from prior ED visits   22 year old female who presents with left hand laceration which do not require suture repair.  Will clean wound, apply bacitracin and dressing.  Strict return precautions given.  Patient verbalizes understanding and agrees with plan of care.      ____________________________________________   FINAL CLINICAL IMPRESSION(S) / ED DIAGNOSES  Final diagnoses:  Laceration of left hand without foreign body, initial encounter     ED Discharge Orders    None       Note:  This document was prepared using Dragon voice recognition software and may include unintentional dictation errors.    Irean HongSung, Jade J,  MD 10/10/18 [phone]

## 2018-10-10 NOTE — Discharge Instructions (Signed)
Keep wound clean and dry.  Return to the ER for worsening symptoms, increased redness/swelling, purulent discharge or other concerns. 

## 2019-12-28 ENCOUNTER — Other Ambulatory Visit: Payer: Self-pay | Admitting: Obstetrics and Gynecology

## 2019-12-28 DIAGNOSIS — E049 Nontoxic goiter, unspecified: Secondary | ICD-10-CM

## 2020-01-02 ENCOUNTER — Ambulatory Visit
Admission: RE | Admit: 2020-01-02 | Discharge: 2020-01-02 | Disposition: A | Discharge status: Home / Self Care | Payer: Federal, State, Local not specified - PPO | Source: Ambulatory Visit | Attending: Obstetrics and Gynecology | Admitting: Obstetrics and Gynecology

## 2020-01-02 DIAGNOSIS — E049 Nontoxic goiter, unspecified: Secondary | ICD-10-CM

## 2020-07-20 ENCOUNTER — Encounter (HOSPITAL_COMMUNITY): Payer: Self-pay | Admitting: Emergency Medicine

## 2020-07-20 ENCOUNTER — Emergency Department (HOSPITAL_COMMUNITY): Payer: Federal, State, Local not specified - PPO

## 2020-07-20 ENCOUNTER — Emergency Department (HOSPITAL_COMMUNITY)
Admission: EM | Admit: 2020-07-20 | Discharge: 2020-07-20 | Disposition: A | Discharge status: Home / Self Care | Payer: Federal, State, Local not specified - PPO | Attending: Emergency Medicine | Admitting: Emergency Medicine

## 2020-07-20 DIAGNOSIS — R5383 Other fatigue: Secondary | ICD-10-CM | POA: Diagnosis not present

## 2020-07-20 DIAGNOSIS — R55 Syncope and collapse: Secondary | ICD-10-CM | POA: Insufficient documentation

## 2020-07-20 LAB — RAPID URINE DRUG SCREEN, HOSP PERFORMED
Amphetamines: NOT DETECTED
Barbiturates: NOT DETECTED
Benzodiazepines: NOT DETECTED
Cocaine: NOT DETECTED
Opiates: NOT DETECTED
Tetrahydrocannabinol: POSITIVE — AB

## 2020-07-20 LAB — CBC WITH DIFFERENTIAL/PLATELET
Abs Immature Granulocytes: 0.01 10*3/uL (ref 0.00–0.07)
Basophils Absolute: 0 10*3/uL (ref 0.0–0.1)
Basophils Relative: 1 %
Eosinophils Absolute: 0 10*3/uL (ref 0.0–0.5)
Eosinophils Relative: 0 %
HCT: 35.5 % — ABNORMAL LOW (ref 36.0–46.0)
Hemoglobin: 10.5 g/dL — ABNORMAL LOW (ref 12.0–15.0)
Immature Granulocytes: 0 %
Lymphocytes Relative: 27 %
Lymphs Abs: 1.3 10*3/uL (ref 0.7–4.0)
MCH: 23.1 pg — ABNORMAL LOW (ref 26.0–34.0)
MCHC: 29.6 g/dL — ABNORMAL LOW (ref 30.0–36.0)
MCV: 78 fL — ABNORMAL LOW (ref 80.0–100.0)
Monocytes Absolute: 0.4 10*3/uL (ref 0.1–1.0)
Monocytes Relative: 8 %
Neutro Abs: 3.1 10*3/uL (ref 1.7–7.7)
Neutrophils Relative %: 64 %
Platelets: 165 10*3/uL (ref 150–400)
RBC: 4.55 MIL/uL (ref 3.87–5.11)
RDW: 19.4 % — ABNORMAL HIGH (ref 11.5–15.5)
WBC: 4.8 10*3/uL (ref 4.0–10.5)
nRBC: 0 % (ref 0.0–0.2)

## 2020-07-20 LAB — URINALYSIS, ROUTINE W REFLEX MICROSCOPIC
Bilirubin Urine: NEGATIVE
Glucose, UA: NEGATIVE mg/dL
Hgb urine dipstick: NEGATIVE
Ketones, ur: NEGATIVE mg/dL
Leukocytes,Ua: NEGATIVE
Nitrite: NEGATIVE
Protein, ur: NEGATIVE mg/dL
Specific Gravity, Urine: 1.004 — ABNORMAL LOW (ref 1.005–1.030)
pH: 7 (ref 5.0–8.0)

## 2020-07-20 LAB — I-STAT BETA HCG BLOOD, ED (MC, WL, AP ONLY): I-stat hCG, quantitative: <5 m[IU]/mL (ref <5)

## 2020-07-20 LAB — BASIC METABOLIC PANEL
Anion gap: 8 (ref 5–15)
BUN: 7 mg/dL (ref 6–20)
CO2: 24 mmol/L (ref 22–32)
Calcium: 9.9 mg/dL (ref 8.9–10.3)
Chloride: 105 mmol/L (ref 98–111)
Creatinine, Ser: 0.7 mg/dL (ref 0.44–1.00)
GFR, Estimated: >60 mL/min (ref >60)
Glucose, Bld: 93 mg/dL (ref 70–99)
Potassium: 4.1 mmol/L (ref 3.5–5.1)
Sodium: 137 mmol/L (ref 135–145)

## 2020-07-20 LAB — CBG MONITORING, ED: Glucose-Capillary: 82 mg/dL (ref 70–99)

## 2020-07-20 MED ORDER — SODIUM CHLORIDE 0.9 % IV BOLUS
1000.0000 mL | Freq: Once | INTRAVENOUS | Status: AC
  Administered 2020-07-20: 1000 mL via INTRAVENOUS

## 2020-07-20 NOTE — ED Triage Notes (Signed)
Per pt, states she is just feeling tired-denies pain-history of lupus and anemia

## 2020-07-20 NOTE — ED Notes (Signed)
Pt ambulated just fine. No signs of dizziness

## 2020-07-20 NOTE — Discharge Instructions (Signed)
Work-up today was overall reassuring. Evaluation and work-up should continue through a primary care provider.  Return to the emergency department for shortness of breath, chest pain, passing out, seizures, abdominal pain, or any other major concerns.

## 2020-07-20 NOTE — ED Provider Notes (Signed)
[facility]-EMERGENCY DEPT Provider Note   CSN: [id number] Arrival date & time: 07/20/20  1558     History Chief Complaint  Patient presents with  . Fatigue    Martha Valentine is a 24 y.o. female.  HPI      Martha Valentine is a 24 y.o. female, with a history of lupus, anemia, presenting to the ED with complaint of syncope/near syncope that occurred shortly prior to arrival.  She is accompanied by her mother at the bedside. Patient states she has been under increased stress lately.  She drove to a park in order to relax.  She states while sitting in her car, she began to feel lightheaded.  She thinks she then passed out because she does not remember some of the time while at the park, but then also states she called her father during this episode and knows she did not hit her head. She has had scattered instances of similar syncope/near syncope in the last few months. She is also had loss of appetite with poor oral intake for several months as well as generalized fatigue.  She states she has not had anything to eat or drink today.  Denies known family history of SCD.  Denies recent use of alcohol or illicit drugs.  No new medications, in fact, patient states she does not take any medications currently. She does complain of left-sided temporal headache, but is unable to tell me whether or not this was present before the syncopal episode.  Denies recent illness, recent falls/trauma, neck/back pain, fever/chills, cough, shortness of breath, chest pain, abdominal pain, numbness, extremity weakness, bladder/bowel incontinence, N/V/D, or any other complaints.  She states she is in the process of [facility] [place].   Past Medical History:  Diagnosis Date  . Anemia   . Breast mass 12/19/2016   Mass upper inner quad X 2 weeks   . Family history of breast cancer   . Family history of ovarian cancer   . Family history  of prostate cancer   . Lupus (systemic lupus erythematosus) (HCC)     Patient Active Problem List   Diagnosis Date Noted  . Family history of breast cancer 05/25/2017  . Family history of ovarian cancer 05/25/2017  . Family history of prostate cancer 05/25/2017    Past Surgical History:  Procedure Laterality Date  . NO PAST SURGERIES       OB History    Gravida  0   Para  0   Term  0   Preterm  0   AB  0   Living  0     SAB  0   TAB  0   Ectopic  0   Multiple  0   Live Births              Family History  Problem Relation Age of Onset  . Breast cancer Mother 1252  . Other Father 5245       Brain Tumor  . Lung cancer Maternal Aunt 65  . Prostate cancer Maternal Grandfather 68       required radiation therapy  . Ovarian cancer Other 85  . Ovarian cancer Other 1945    Social History   Tobacco Use  . Smoking status: Never Smoker  . Smokeless tobacco: Never Used  Substance Use Topics  . Alcohol use: No  . Drug use: Yes    Types: Marijuana  Home Medications Prior to Admission medications   Medication Sig Start Date End Date Taking? Authorizing Provider  hydroxychloroquine (PLAQUENIL) 200 MG tablet Take 1.5 tablets (300 mg total) by mouth daily. Patient not taking: Reported on 11/10/2017 02/11/16   Rasch, Victorino Dike I, NP  ondansetron (ZOFRAN ODT) 4 MG disintegrating tablet Take 1 tablet (4 mg total) by mouth every 8 (eight) hours as needed for nausea or vomiting. 04/17/18   Elson Areas, PA-C    Allergies    Patient has no known allergies.  Review of Systems   Review of Systems  Constitutional: Positive for appetite change and fatigue. Negative for diaphoresis and fever.  Respiratory: Negative for cough and shortness of breath.   Cardiovascular: Negative for chest pain.  Gastrointestinal: Negative for abdominal pain, diarrhea, nausea and vomiting.  Genitourinary: Negative for difficulty urinating and dysuria.  Musculoskeletal: Negative for back  pain and neck pain.  Neurological: Positive for syncope and headaches. Negative for dizziness, seizures, facial asymmetry, speech difficulty, weakness, light-headedness and numbness.  All other systems reviewed and are negative.   Physical Exam Updated Vital Signs BP (!) 134/92 (BP Location: Left Arm)   Pulse 80   Temp 98.4 F (36.9 C) (Oral)   Resp 17   SpO2 100%   Physical Exam Vitals and nursing note reviewed.  Constitutional:      General: She is not in acute distress.    Appearance: She is well-developed. She is not diaphoretic.  HENT:     Head: Normocephalic.     Comments: Some tenderness along the sides of the patient's scalp in the temporal and parietal regions with no swelling, deformity, wounds, or color abnormality.    Nose: Nose normal.     Mouth/Throat:     Mouth: Mucous membranes are dry.     Pharynx: Oropharynx is clear.     Comments: No noted intraoral trauma. Eyes:     Extraocular Movements: Extraocular movements intact.     Conjunctiva/sclera: Conjunctivae normal.     Pupils: Pupils are equal, round, and reactive to light.  Neck:     Comments: No onset of symptoms with range of motion of the neck. Cardiovascular:     Rate and Rhythm: Normal rate and regular rhythm.     Pulses: Normal pulses.          Radial pulses are 2+ on the right side and 2+ on the left side.       Posterior tibial pulses are 2+ on the right side and 2+ on the left side.     Heart sounds: Normal heart sounds. No murmur heard.      Comments: Tactile temperature in the extremities appropriate and equal bilaterally. Pulmonary:     Effort: Pulmonary effort is normal. No respiratory distress.     Breath sounds: Normal breath sounds.  Abdominal:     Palpations: Abdomen is soft.     Tenderness: There is no abdominal tenderness. There is no guarding.  Musculoskeletal:     Cervical back: Normal range of motion and neck supple. No tenderness.     Right lower leg: No edema.     Left lower  leg: No edema.     Comments: Normal motor function intact in all extremities. No midline spinal tenderness.   Lymphadenopathy:     Cervical: No cervical adenopathy.  Skin:    General: Skin is warm and dry.     Capillary Refill: Capillary refill takes less than 2 seconds.  Neurological:  Mental Status: She is alert and oriented to person, place, and time.     Comments: No noted acute cognitive deficit. Sensation grossly intact to light touch in the extremities.   Grip strengths equal bilaterally.   Strength 5/5 in all extremities.  No gait disturbance. Coordination intact. Cranial nerves III-XII grossly intact.  Handles oral secretions without noted difficulty.  No noted phonation or speech deficit. No facial droop.   Psychiatric:        Mood and Affect: Mood and affect normal.        Speech: Speech normal.        Behavior: Behavior normal.     ED Results / Procedures / Treatments   Labs (all labs ordered are listed, but only abnormal results are displayed) Labs Reviewed  URINALYSIS, ROUTINE W REFLEX MICROSCOPIC - Abnormal; Notable for the following components:      Result Value   Color, Urine STRAW (*)    Specific Gravity, Urine 1.004 (*)    All other components within normal limits  RAPID URINE DRUG SCREEN, HOSP PERFORMED - Abnormal; Notable for the following components:   Tetrahydrocannabinol POSITIVE (*)    All other components within normal limits  CBC WITH DIFFERENTIAL/PLATELET - Abnormal; Notable for the following components:   Hemoglobin 10.5 (*)    HCT 35.5 (*)    MCV 78.0 (*)    MCH 23.1 (*)    MCHC 29.6 (*)    RDW 19.4 (*)    All other components within normal limits  BASIC METABOLIC PANEL  CBG MONITORING, ED  I-STAT BETA HCG BLOOD, ED ([facility], [facility], [facility] ONLY)    EKG EKG Interpretation  Date/Time:  Friday July 20 2020 17:10:39 EDT Ventricular Rate:  73 PR Interval:    QRS Duration: 75 QT Interval:  363 QTC Calculation: 400 R Axis:   81 Text  Interpretation: Sinus rhythm RSR' in V1 or V2, probably normal variant No significant change since last tracing Confirmed by Linwood Dibbles [phone]) on 07/20/2020 5:30:56 PM   Radiology DG Chest 2 View  Result Date: 07/20/2020 CLINICAL DATA:  Syncopal episode today EXAM: CHEST - 2 VIEW COMPARISON:  None. FINDINGS: The heart size and mediastinal contours are within normal limits. Both lungs are clear. The visualized skeletal structures are unremarkable. IMPRESSION: No active cardiopulmonary disease. Electronically Signed   By: Alcide Clever M.D.   On: 07/20/2020 19:12   CT Head Wo Contrast  Result Date: 07/20/2020 CLINICAL DATA:  Worsening headaches EXAM: CT HEAD WITHOUT CONTRAST TECHNIQUE: Contiguous axial images were obtained from the base of the skull through the vertex without intravenous contrast. COMPARISON:  None. FINDINGS: Brain: No evidence of acute infarction, hemorrhage, hydrocephalus, extra-axial collection or mass lesion/mass effect. Vascular: No hyperdense vessel or unexpected calcification. Skull: Normal. Negative for fracture or focal lesion. Sinuses/Orbits: No acute finding. Other: None. IMPRESSION: Normal head CT for age Electronically Signed   By: Alcide Clever M.D.   On: 07/20/2020 19:06    Procedures Procedures (including critical care time)  Medications Ordered in ED Medications  sodium chloride 0.9 % bolus 1,000 mL (0 mLs Intravenous Stopped 07/20/20 1906)    ED Course  I have reviewed the triage vital signs and the nursing notes.  Pertinent labs & imaging results that were available during my care of the patient were reviewed by me and considered in my medical decision making (see chart for details).    MDM Rules/Calculators/A&P  Patient presents following an episode of syncope/near syncope. I question full syncope as the patient gives indication that she may have been aware during the episode. Description of the episode and subsequent evaluation  here in the ED did not have features indicative of high risk syncope. Patient is nontoxic appearing, afebrile, not tachycardic, not tachypneic, not hypotensive, maintains excellent SPO2 on room air, and is in no apparent distress.   I have reviewed the patient's chart to obtain more information.   I reviewed and interpreted the patient's labs and radiological studies.   EKG findings: I think arrhythmia is unlikely. EKG shows sinus rhythm with no interval abnormalities such as QT prolongation or WPW. There are no findings to suggest Brugada syndrome. Cardiac monitoring in the emergency department reveals no tachycardic or bradycardic dysrhythmia. Hypertrophic cardiomyopathy was considered, but there are no clear historical elements pointing toward this. Additionally, EKG is not suggestive; i.e. the QRS voltage is not extremely large and there are no suggestive Q waves. No noted murmur on exam. CXR without cardiomegaly or other acute abnormality.  Recommend follow-up with PCP.   Findings and plan of care discussed with Linwood Dibbles, MD. Dr. Lynelle Doctor personally evaluated and examined this patient.  Vitals:   07/20/20 1815 07/20/20 1901 07/20/20 1936 07/20/20 2000  BP: 132/86 131/77 125/84   Pulse: 69 74 74   Resp: 14 12 (!) 30   Temp:    98.5 F (36.9 C)  TempSrc:    Oral  SpO2: 100% 100% 100%      Final Clinical Impression(s) / ED Diagnoses Final diagnoses:  Other fatigue    Rx / DC Orders ED Discharge Orders    None       Concepcion Living 07/20/20 2101    Linwood Dibbles, MD 07/21/20 [phone]

## 2021-02-19 ENCOUNTER — Ambulatory Visit: Payer: Federal, State, Local not specified - PPO | Admitting: Endocrinology

## 2021-04-26 ENCOUNTER — Other Ambulatory Visit: Payer: Self-pay

## 2021-04-26 ENCOUNTER — Ambulatory Visit (INDEPENDENT_AMBULATORY_CARE_PROVIDER_SITE_OTHER): Payer: BLUE CROSS/BLUE SHIELD | Admitting: Endocrinology

## 2021-04-26 ENCOUNTER — Encounter: Payer: Self-pay | Admitting: Endocrinology

## 2021-04-26 DIAGNOSIS — E0590 Thyrotoxicosis, unspecified without thyrotoxic crisis or storm: Secondary | ICD-10-CM | POA: Diagnosis not present

## 2021-04-26 NOTE — Progress Notes (Signed)
Subjective:    Patient ID: Martha Valentine, female    DOB: [birth year], 25 y.o.   MRN: [id number]  HPI Pt is referred by Dr Elon Spanner, for hyperthyroidism.  she was dx'ed with hyperthyroidism in 2021.  He has never been on therapy for this.  He has never had XRT to the anterior neck, or thyroid surgery. she does not consume kelp or any other non-prescribed thyroid medication.  she has never been on amiodarone.  She reports anxiety and heat intolerance. Past Medical History:  Diagnosis Date   Anemia    Breast mass 12/19/2016   Mass upper inner quad X 2 weeks    Family history of breast cancer    Family history of ovarian cancer    Family history of prostate cancer    Lupus (systemic lupus erythematosus) (HCC)     Past Surgical History:  Procedure Laterality Date   NO PAST SURGERIES      Social History   Socioeconomic History   Marital status: Single    Spouse name: Not on file   Number of children: Not on file   Years of education: Not on file   Highest education level: Not on file  Occupational History   Not on file  Tobacco Use   Smoking status: Never   Smokeless tobacco: Never  Substance and Sexual Activity   Alcohol use: No   Drug use: Yes    Types: Marijuana   Sexual activity: Not on file  Other Topics Concern   Not on file  Social History Narrative   Not on file   Social Determinants of Health   Financial Resource Strain: Not on file  Food Insecurity: Not on file  Transportation Needs: Not on file  Physical Activity: Not on file  Stress: Not on file  Social Connections: Not on file  Intimate Partner Violence: Not on file    Current Outpatient Medications on File Prior to Visit  Medication Sig Dispense Refill   hydroxychloroquine (PLAQUENIL) 200 MG tablet Take 1.5 tablets (300 mg total) by mouth daily. (Patient not taking: No sig reported) 60 tablet 1   ondansetron (ZOFRAN ODT) 4 MG disintegrating tablet Take 1 tablet (4 mg total) by mouth every 8 (eight)  hours as needed for nausea or vomiting. (Patient not taking: Reported on 04/26/2021) 20 tablet 0   No current facility-administered medications on file prior to visit.    No Known Allergies  Family History  Problem Relation Age of Onset   Breast cancer Mother 28   Other Father 52       Brain Tumor   Prostate cancer Maternal Grandfather 62       required radiation therapy   Lung cancer Maternal Aunt 65   Ovarian cancer Other 85   Ovarian cancer Other 45   Thyroid disease Neg Hx     BP 116/70 (BP Location: Left Arm, Patient Position: Sitting, Cuff Size: Normal)   Pulse 91   Ht 5\' 8"  (1.727 m)   Wt 118 lb (53.5 kg)   SpO2 98%   BMI 17.94 kg/m    Review of Systems denies weight loss, palpitations, sob, excessive diaphoresis, and tremor.    Objective:   Physical Exam VS: see vs page GEN: no distress HEAD: head: no deformity eyes: no periorbital swelling, no proptosis external nose and ears are normal NECK: thyroid is slightly and diffusely enlarged.  No palpable nodule.  CHEST WALL: no deformity LUNGS: clear to auscultation CV: reg rate and  rhythm, no murmur.  MUSCULOSKELETAL: gait is normal and steady.   EXTEMITIES: no deformity.  no leg edema NEURO:  readily moves all 4's.  sensation is intact to touch on all 4's.  No tremor SKIN:  Normal texture and temperature.  No rash or suspicious lesion is visible.  Not diaphoretic NODES:  None palpable at the neck PSYCH: alert, well-oriented.  Does not appear anxious nor depressed.  outside test results are reviewed:  TSH=0.41  [place] (2021): Scattered bilateral punctate (sub 4 mm) anechoic cysts within an otherwise normal-appearing and sized thyroid. None of these punctate cysts meet imaging criteria to recommend percutaneous sampling or continue    Assessment & Plan:  Hyperthyroidism, new to me.    Patient Instructions  Please sign a release of information from Dr Elon Spanner.  If it is overactive, I'll prescribe for you a  pill to slow it down to normal.

## 2021-04-26 NOTE — Patient Instructions (Addendum)
Please sign a release of information from Dr Elon Spanner.  If it is overactive, I'll prescribe for you a pill to slow it down to normal.

## 2021-04-27 DIAGNOSIS — E0590 Thyrotoxicosis, unspecified without thyrotoxic crisis or storm: Secondary | ICD-10-CM | POA: Insufficient documentation

## 2022-07-06 ENCOUNTER — Emergency Department (HOSPITAL_BASED_OUTPATIENT_CLINIC_OR_DEPARTMENT_OTHER)
Admission: EM | Admit: 2022-07-06 | Discharge: 2022-07-06 | Disposition: A | Discharge status: Home / Self Care | Payer: Federal, State, Local not specified - PPO | Attending: Emergency Medicine | Admitting: Emergency Medicine

## 2022-07-06 ENCOUNTER — Emergency Department (HOSPITAL_BASED_OUTPATIENT_CLINIC_OR_DEPARTMENT_OTHER): Payer: Federal, State, Local not specified - PPO | Admitting: Radiology

## 2022-07-06 ENCOUNTER — Encounter (HOSPITAL_BASED_OUTPATIENT_CLINIC_OR_DEPARTMENT_OTHER): Payer: Self-pay

## 2022-07-06 DIAGNOSIS — W230XXS Caught, crushed, jammed, or pinched between moving objects, sequela: Secondary | ICD-10-CM | POA: Diagnosis not present

## 2022-07-06 DIAGNOSIS — S61321S Laceration with foreign body of left index finger with damage to nail, sequela: Secondary | ICD-10-CM | POA: Diagnosis not present

## 2022-07-06 DIAGNOSIS — Z23 Encounter for immunization: Secondary | ICD-10-CM | POA: Insufficient documentation

## 2022-07-06 DIAGNOSIS — S6992XS Unspecified injury of left wrist, hand and finger(s), sequela: Secondary | ICD-10-CM | POA: Diagnosis present

## 2022-07-06 DIAGNOSIS — M79645 Pain in left finger(s): Secondary | ICD-10-CM | POA: Insufficient documentation

## 2022-07-06 DIAGNOSIS — S61319S Laceration without foreign body of unspecified finger with damage to nail, sequela: Secondary | ICD-10-CM

## 2022-07-06 MED ORDER — TETANUS-DIPHTH-ACELL PERTUSSIS 5-2.5-18.5 LF-MCG/0.5 IM SUSY
0.5000 mL | PREFILLED_SYRINGE | Freq: Once | INTRAMUSCULAR | Status: AC
  Administered 2022-07-06: 0.5 mL via INTRAMUSCULAR
  Filled 2022-07-06: qty 0.5

## 2022-07-06 MED ORDER — CEPHALEXIN 500 MG PO CAPS: 500.0000 mg | ORAL_CAPSULE | Freq: Four times a day (QID) | ORAL | 0 refills | Status: DC

## 2022-07-06 MED ORDER — BACITRACIN ZINC 500 UNIT/GM EX OINT: TOPICAL_OINTMENT | Freq: Once | CUTANEOUS | Status: DC

## 2022-07-06 NOTE — ED Provider Notes (Signed)
[facility] EMERGENCY DEPT Provider Note   CSN: [id number] Arrival date & time: 07/06/22  1248     History  Chief Complaint  Patient presents with   Hand Pain    Martha Valentine is a 26 y.o. female who presents to the ED complaining of left index finger pain onset yesterday at 11 AM. She notes that her finger was closed into a car door. Notes that she still has pain to the area. Tried ibuprofen and tylenol at home. Also tried ice at home. No redness, fever, drainage noted. No PCP at this time.   The history is provided by the patient. No language interpreter was used.       Home Medications Prior to Admission medications   Medication Sig Start Date End Date Taking? Authorizing Provider  cephALEXin (KEFLEX) 500 MG capsule Take 1 capsule (500 mg total) by mouth 4 (four) times daily. 07/06/22  Yes Vasili Fok A, PA-C  hydroxychloroquine (PLAQUENIL) 200 MG tablet Take 1.5 tablets (300 mg total) by mouth daily. Patient not taking: No sig reported 02/11/16   Rasch, Victorino Dike I, NP  ondansetron (ZOFRAN ODT) 4 MG disintegrating tablet Take 1 tablet (4 mg total) by mouth every 8 (eight) hours as needed for nausea or vomiting. Patient not taking: Reported on 04/26/2021 04/17/18   Elson Areas, PA-C      Allergies    Patient has no known allergies.    Review of Systems   Review of Systems  Musculoskeletal:  Positive for arthralgias and joint swelling.  Skin:  Positive for wound. Negative for color change.  All other systems reviewed and are negative.   Physical Exam Updated Vital Signs BP (!) 121/90 (BP Location: Right Arm)   Pulse 80   Temp 98.6 F (37 C) (Oral)   Resp 16   Ht 5\' 8"  (1.727 m)   Wt 81.6 kg   LMP 06/08/2022   SpO2 100%   BMI 27.37 kg/m  Physical Exam Vitals and nursing note reviewed.  Constitutional:      General: She is not in acute distress.    Appearance: Normal appearance.  Eyes:     General: No scleral icterus.    Extraocular  Movements: Extraocular movements intact.  Cardiovascular:     Rate and Rhythm: Normal rate.  Pulmonary:     Effort: Pulmonary effort is normal. No respiratory distress.  Musculoskeletal:     Cervical back: Neck supple.     Comments: Tenderness to palpation to proximal aspect of left second digit with 0.5 cm laceration that has begun to heal noted to the area.  Full active range of motion against resistance to left second digit.  Normal finger to thumb opposition.  Radial pulse intact.  Capillary refill less than 2 seconds.    Skin:    General: Skin is warm and dry.     Findings: No bruising, erythema or rash.  Neurological:     Mental Status: She is alert.  Psychiatric:        Behavior: Behavior normal.     ED Results / Procedures / Treatments   Labs (all labs ordered are listed, but only abnormal results are displayed) Labs Reviewed - No data to display  EKG None  Radiology DG Finger Index Left  Result Date: 07/06/2022 CLINICAL DATA:  Index finger trauma EXAM: LEFT INDEX FINGER 2+V COMPARISON:  None Available. FINDINGS: There is no evidence of fracture or dislocation. There is no evidence of arthropathy or other focal bone  abnormality. Soft tissues are unremarkable. IMPRESSION: Negative. Electronically Signed   By: Davina Poke D.O.   On: 07/06/2022 13:23    Procedures Procedures    Medications Ordered in ED Medications  bacitracin ointment (has no administration in time range)  Tdap (BOOSTRIX) injection 0.5 mL (0.5 mLs Intramuscular Given 07/06/22 1351)    ED Course/ Medical Decision Making/ A&P                           Medical Decision Making Amount and/or Complexity of Data Reviewed Radiology: ordered.  Risk OTC drugs. Prescription drug management.   Patient with left index pain onset yesterday at 11 AM status post shutting her finger in the car door.  Has tried conservative therapies at home for symptoms.  Does not have a primary care provider at this  time.  Patient afebrile.  On exam patient with tenderness to palpation to proximal aspect of left second digit with 0.5 cm laceration that has begun to heal noted to the area.  Full active range of motion against resistance to left second digit.  Normal finger to thumb opposition.  Radial pulse intact.  Capillary refill less than 2 seconds. Differential diagnosis includes fracture, dislocation, contusion.  Imaging: I ordered imaging studies including left index finger x-ray I independently visualized and interpreted imaging which showed: No acute fracture or dislocation I agree with the radiologist interpretation  Medications:  I ordered medication including tetanus, bacitracin for prophylaxis  I have reviewed the patients home medicines and have made adjustments as needed  Disposition: Presentation suspicious for pain to left index finger status post injury. Doubt fracture, dislocation at this time.  Laceration not repaired in the ED due to being greater than 12 hours old.  Area thoroughly irrigated with bacitracin ointment applied to the area.  After consideration of the diagnostic results and the patients response to treatment, I feel that the patient would benefit from Discharge home.  Patient provided with a finger splint today in the emergency department.  Tetanus updated in the emergency department.  Bacitracin applied to the area.  Patient provided with information for on-call orthopedist for as needed follow-up.  Patient provided with information for [facility] health community health and wellness.  Work note provided.  Keflex prescription sent to patient pharmacy today.  Supportive care measures and strict return precautions discussed with patient at bedside. Pt acknowledges and verbalizes understanding. Pt appears safe for discharge. Follow up as indicated in discharge paperwork.    This chart was dictated using voice recognition software, Dragon. Despite the best efforts of this provider to  proofread and correct errors, errors may still occur which can change documentation meaning.   Final Clinical Impression(s) / ED Diagnoses Final diagnoses:  Finger pain, left  Laceration of finger of left hand with damage to nail, foreign body presence unspecified, unspecified finger, sequela    Rx / DC Orders ED Discharge Orders          Ordered    cephALEXin (KEFLEX) 500 MG capsule  4 times daily        07/06/22 1357              Clearnce Leja A, PA-C 07/06/22 1358    Fransico Meadow, MD 07/07/22 1158

## 2022-07-06 NOTE — ED Triage Notes (Signed)
Pt states she closed her left index finger in the car door yesterday. Swelling noted.

## 2022-07-06 NOTE — Discharge Instructions (Addendum)
It was a pleasure taking care of you!   Your x-ray was negative for fracture or dislocation.  You will be sent a prescription for Keflex, take as directed.  You may take over the counter 600 mg Ibuprofen every 6 hours and alternate with 500 mg Tylenol every 6 hours as needed for pain for no more than 7 days. You will be given a finger splint today. Wear it during the day, you may remove it at night. You may apply ice to affected area for up to 15 minutes at a time. Ensure to place a barrier between your skin and the ice.  Attached is information for the on-call hand specialist to follow-up as needed.  You may follow-up with your primary care provider as needed.  Return to the Emergency Department if you are experiencing increasing/worsening pain, swelling, color change, fever, or worsening symptoms.

## 2022-07-29 IMAGING — CR DG CHEST 2V
2 series · 2 of 2 positions shown · non-contrast
Comparison: None.

CLINICAL DATA: Syncopal episode today

EXAM:
CHEST - 2 VIEW

[w chest pa]
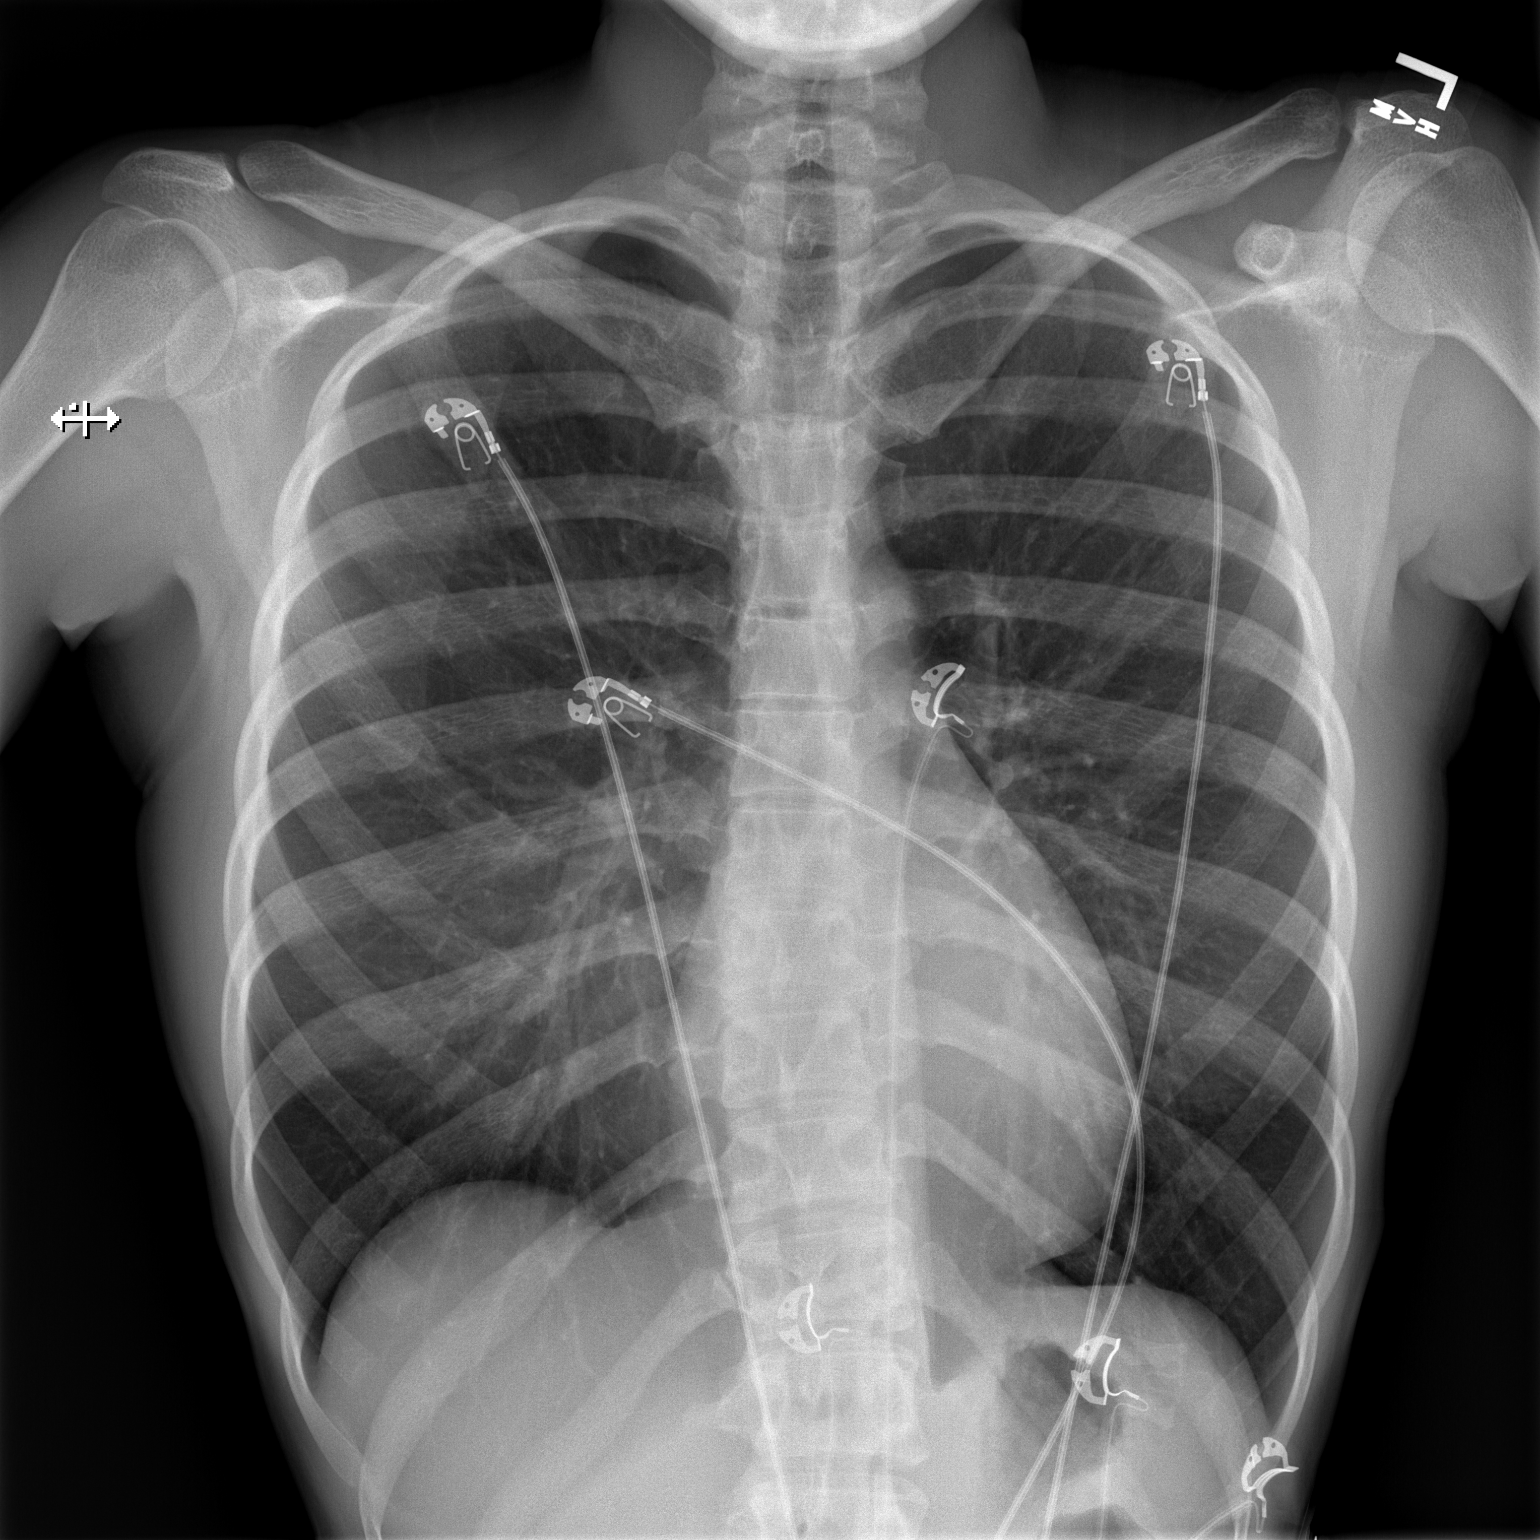

[w chest lat]
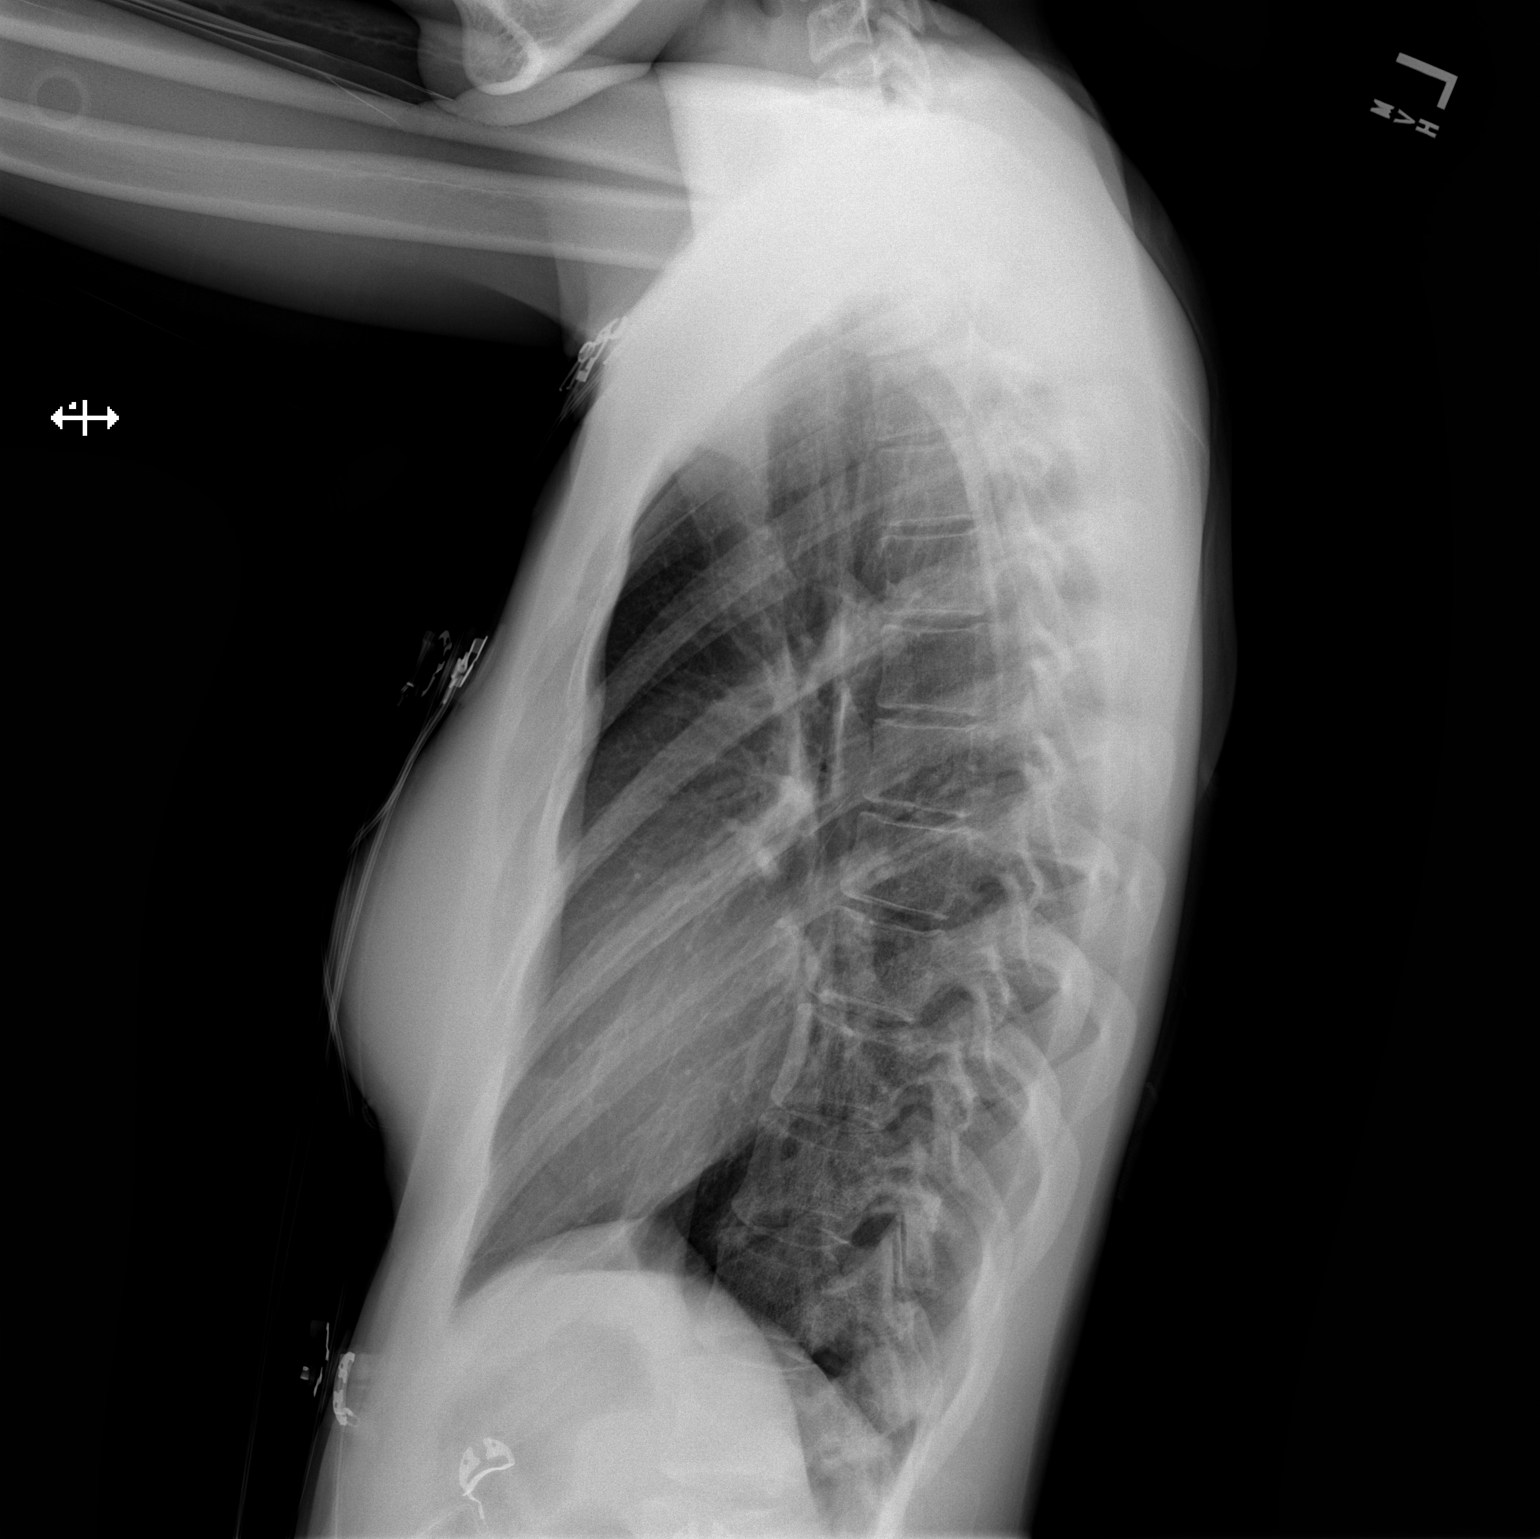

[2 of 2 positions shown; findings below may reference images not displayed]

FINDINGS: The heart size and mediastinal contours are within normal limits.
Both lungs are clear. The visualized skeletal structures are
unremarkable.
IMPRESSION: No active cardiopulmonary disease.

## 2022-07-29 IMAGING — CT CT HEAD W/O CM
3 series · 15 of 47 positions shown, 18 images · non-contrast
Comparison: None.

CLINICAL DATA: Worsening headaches

EXAM:
CT HEAD WITHOUT CONTRAST
TECHNIQUE: Contiguous axial images were obtained from the base of the skull
through the vertex without intravenous contrast.

[Series 2: head wo · axial · 0.47mm/px · z∈[-57,+68]mm · 9 of 31 slices shown, 12 images]
[im 3/31  brain]
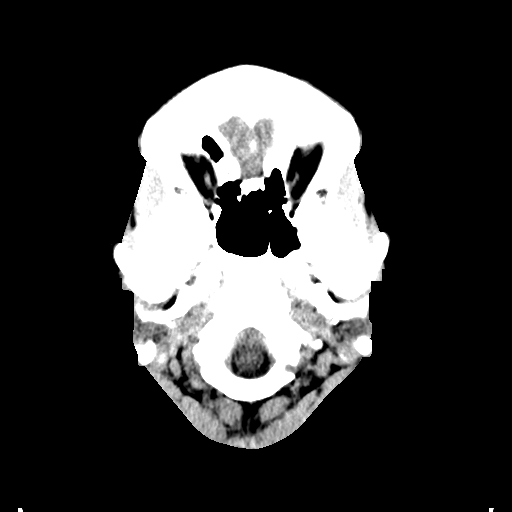
[im 3/31  bone]
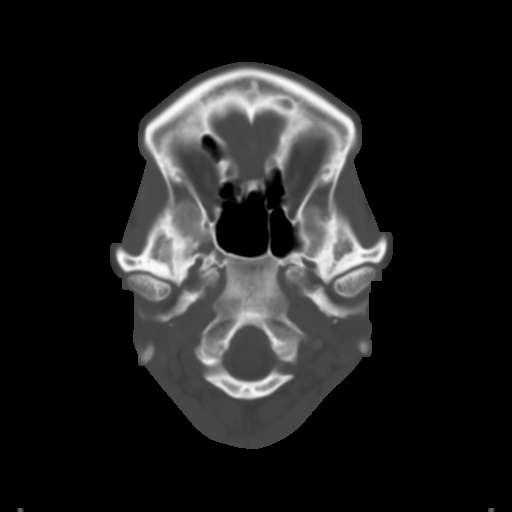
[im 6/31  brain]
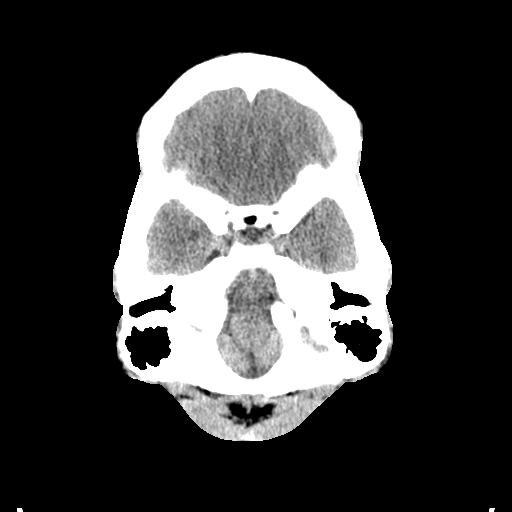
[im 9/31  brain]
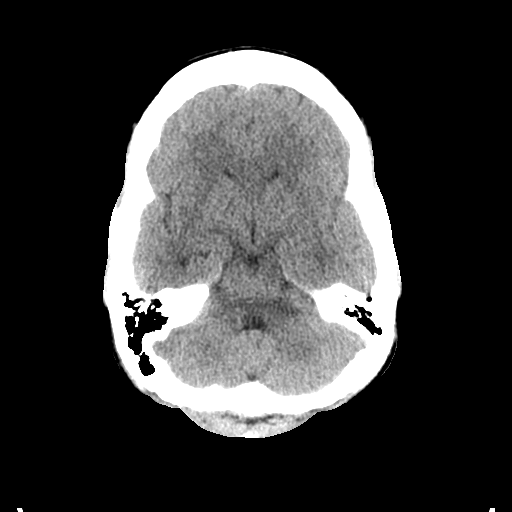
[im 12/31  brain]
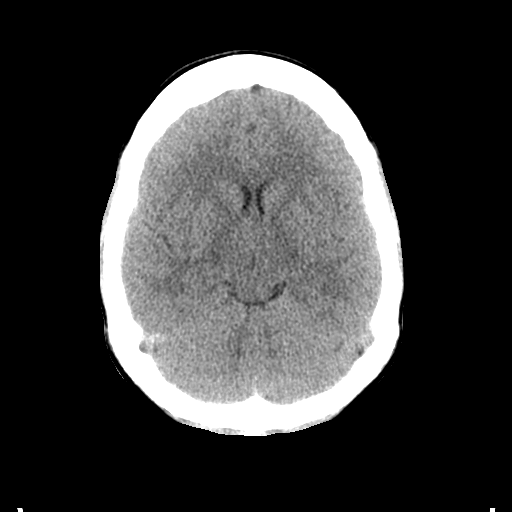
[im 16/31  brain]
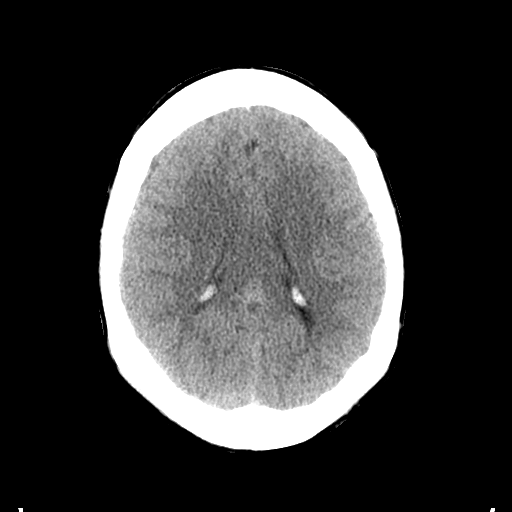
[im 16/31  bone]
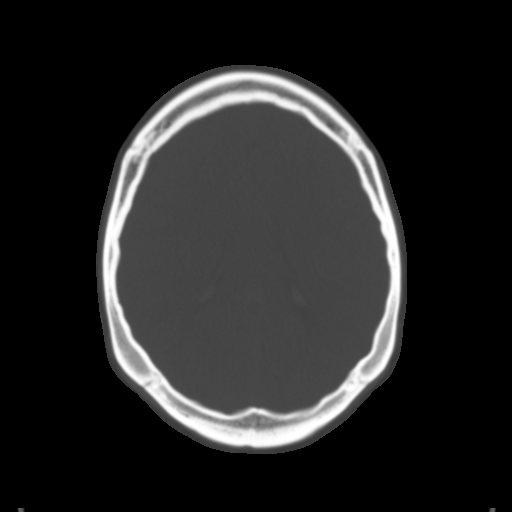
[im 19/31  brain]
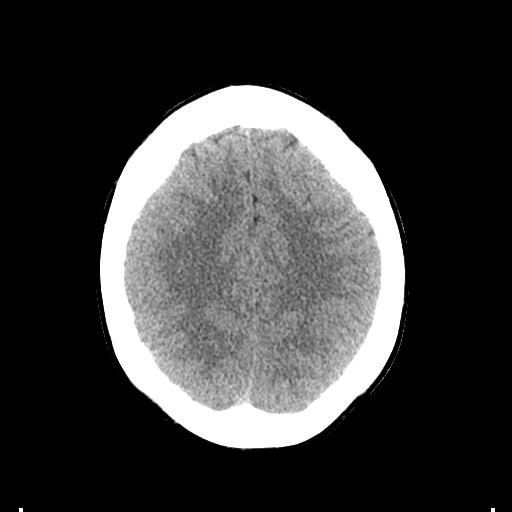
[im 22/31  brain]
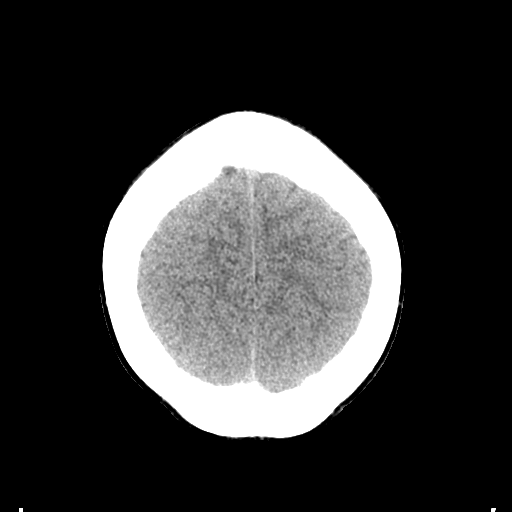
[im 25/31  brain]
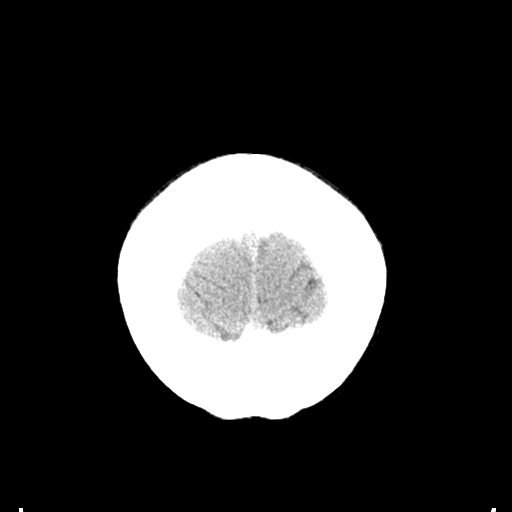
[im 28/31  brain]
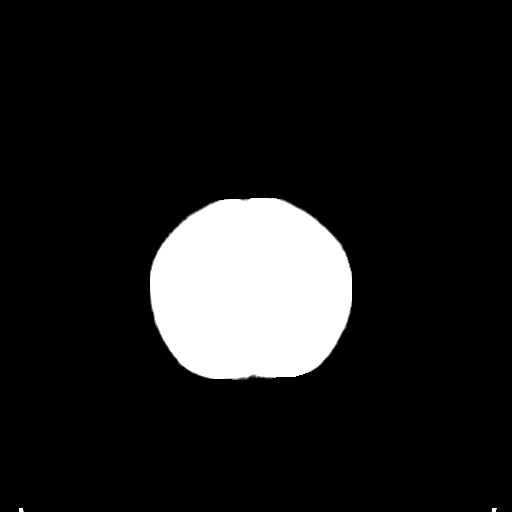
[im 28/31  bone]
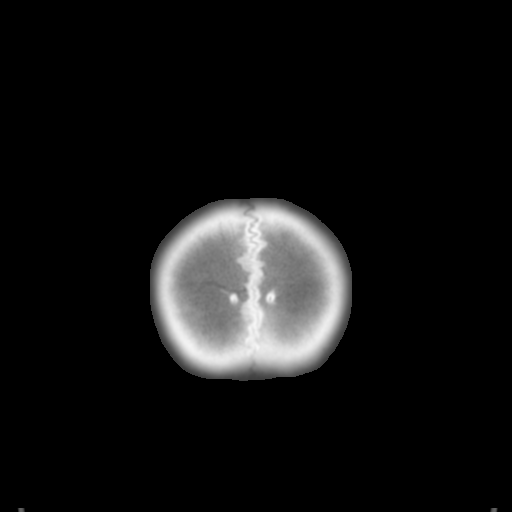

[Series 5: coronal soft tissue · coronal · 0.29mm/px · 3 of 68 slices shown]
[im 23/68  brain]
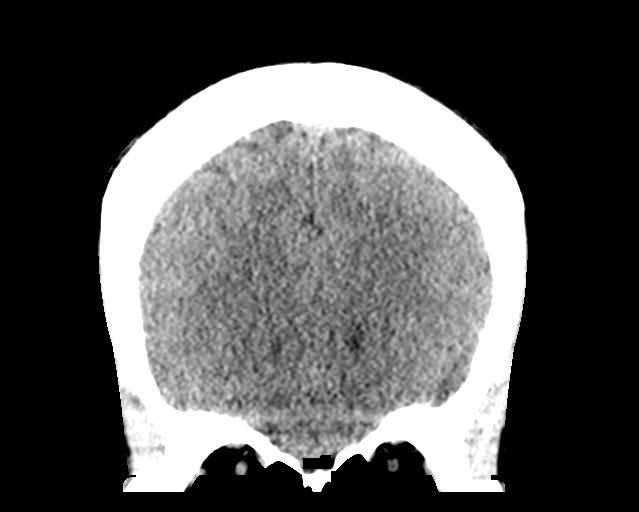
[im 30/68  brain]
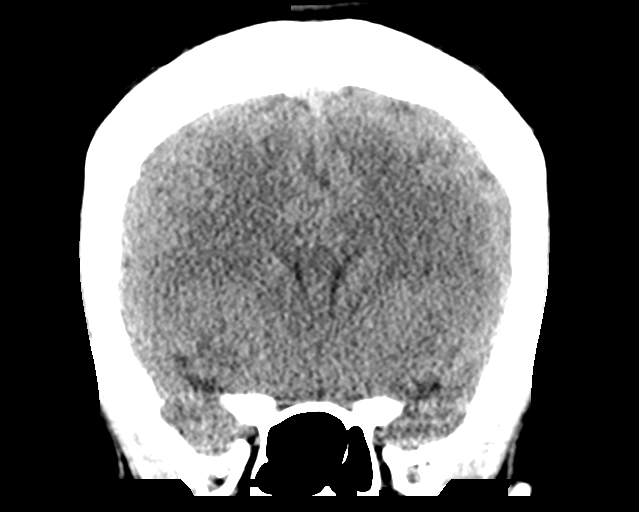
[im 38/68  brain]
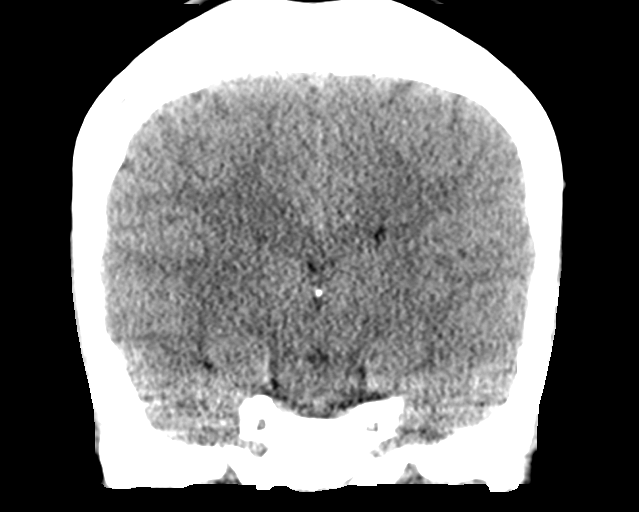

[Series 6: sagittal soft tissue · sagittal · 0.29mm/px · 3 of 63 slices shown]
[im 21/63  brain]
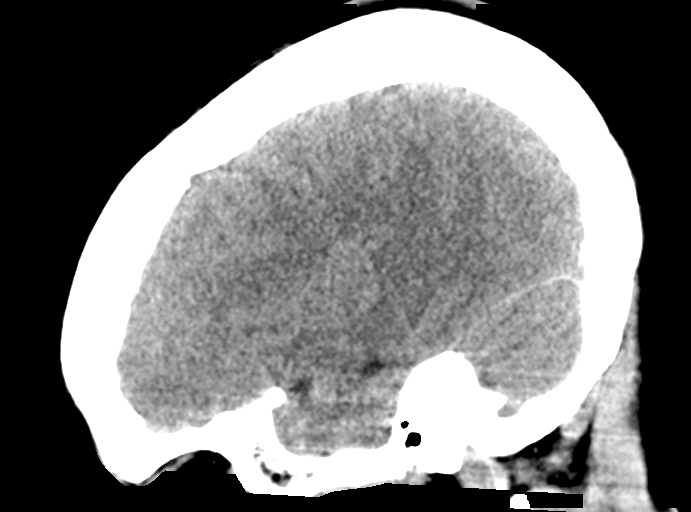
[im 32/63  brain]
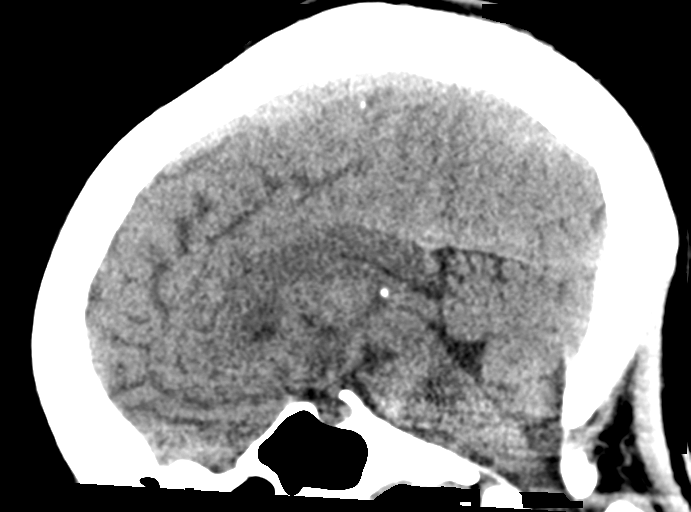
[im 42/63  brain]
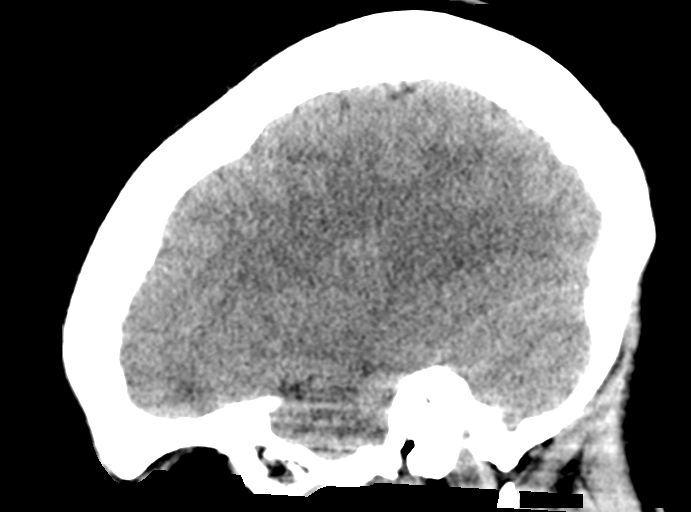

[15 of 47 positions shown; findings below may reference images not displayed]

FINDINGS: Brain: No evidence of acute infarction, hemorrhage, hydrocephalus,
extra-axial collection or mass lesion/mass effect.

Vascular: No hyperdense vessel or unexpected calcification.

Skull: Normal. Negative for fracture or focal lesion.

Sinuses/Orbits: No acute finding.

Other: None.
IMPRESSION: Normal head CT for age

## 2023-04-09 ENCOUNTER — Ambulatory Visit (INDEPENDENT_AMBULATORY_CARE_PROVIDER_SITE_OTHER): Payer: BC Managed Care – PPO

## 2023-04-09 ENCOUNTER — Ambulatory Visit (HOSPITAL_COMMUNITY)
Admission: EM | Admit: 2023-04-09 | Discharge: 2023-04-09 | Disposition: A | Discharge status: Home / Self Care | Payer: BC Managed Care – PPO | Attending: Urgent Care | Admitting: Urgent Care

## 2023-04-09 ENCOUNTER — Encounter (HOSPITAL_COMMUNITY): Payer: Self-pay | Admitting: Emergency Medicine

## 2023-04-09 ENCOUNTER — Other Ambulatory Visit: Payer: Self-pay

## 2023-04-09 DIAGNOSIS — M546 Pain in thoracic spine: Secondary | ICD-10-CM

## 2023-04-09 DIAGNOSIS — S29012A Strain of muscle and tendon of back wall of thorax, initial encounter: Secondary | ICD-10-CM

## 2023-04-09 MED ORDER — METAXALONE 800 MG PO TABS: 800.0000 mg | ORAL_TABLET | Freq: Three times a day (TID) | ORAL | 0 refills | Status: DC

## 2023-04-09 MED ORDER — DICLOFENAC SODIUM 75 MG PO TBEC: 75.0000 mg | DELAYED_RELEASE_TABLET | Freq: Two times a day (BID) | ORAL | 0 refills | Status: AC

## 2023-04-09 NOTE — ED Triage Notes (Signed)
Complains of back pain that started Tuesday.  Reports playing around at home and stumbled into a couch and says she landed wrong.  Patient has taken ibuprofen and continues to have pain.  Reports work told her to get checked out .  Pain is in between shoulder blades.

## 2023-04-09 NOTE — Discharge Instructions (Signed)
Your xray shows mild scoliosis to the in part of your thoracic spine, otherwise is normal. I suspect your pain to be muscular in nature. Please purchase a microwavable heat pack and apply to the affected area of your shoulder blades and back.  Please take the diclofenac twice daily with food.  Please avoid any additional over-the-counter anti-inflammatories such as ibuprofen, naproxen, Aleve, Motrin.  Take the Skelaxin up to 3 times daily, as tolerated.  This medication may make you feel drowsy or sedated therefore do not drive a car or operate machinery if taking.  Should your symptoms persist or worsen, please return for reevaluation.

## 2023-04-09 NOTE — ED Provider Notes (Signed)
[facility]    CSN: [id number] Arrival date & time: 04/09/23  1342      History   Chief Complaint Chief Complaint  Patient presents with   Back Pain    HPI Martha Valentine is a 27 y.o. female.   27 year old female presents today due to concerns of back pain.  She states it hurts primarily to her spine between her shoulder blade region.  Any movement of her shoulders causes increasing pain, and reports a severe pulling sensation with any abduction or external rotation above 90 degrees.  She states on Tuesday she was playing around at home, and fell onto her back, thought she heard a pop.  She has been taking 400 mg of ibuprofen every 6 hours since the injury with no improvement to her symptoms.  She denies any weakness in her extremities or radicular symptoms.  No pins-and-needles or paresthesias. Does have known hx of scoliosis to L spine. Denies headache, neck pain or stiffness.    Back Pain   Past Medical History:  Diagnosis Date   Anemia    Breast mass 12/19/2016   Mass upper inner quad X 2 weeks    Family history of breast cancer    Family history of ovarian cancer    Family history of prostate cancer    Lupus (systemic lupus erythematosus) (HCC)     Patient Active Problem List   Diagnosis Date Noted   Hyperthyroidism 04/27/2021   Family history of breast cancer 05/25/2017   Family history of ovarian cancer 05/25/2017   Family history of prostate cancer 05/25/2017    Past Surgical History:  Procedure Laterality Date   NO PAST SURGERIES      OB History     Gravida  0   Para  0   Term  0   Preterm  0   AB  0   Living  0      SAB  0   IAB  0   Ectopic  0   Multiple  0   Live Births               Home Medications    Prior to Admission medications   Medication Sig Start Date End Date Taking? Authorizing Provider  diclofenac (VOLTAREN) 75 MG EC tablet Take 1 tablet (75 mg total) by mouth 2 (two) times daily with a meal for 10  days. 04/09/23 04/19/23 Yes Dayna Geurts L, PA  metaxalone (SKELAXIN) 800 MG tablet Take 1 tablet (800 mg total) by mouth 3 (three) times daily. 04/09/23  Yes Haley Roza L, PA    Family History Family History  Problem Relation Age of Onset   Breast cancer Mother 86   Other Father 49       Brain Tumor   Prostate cancer Maternal Grandfather 106       required radiation therapy   Lung cancer Maternal Aunt 65   Ovarian cancer Other 85   Ovarian cancer Other 45   Thyroid disease Neg Hx     Social History Social History   Tobacco Use   Smoking status: Never   Smokeless tobacco: Never  Vaping Use   Vaping Use: Some days  Substance Use Topics   Alcohol use: Yes   Drug use: Not Currently    Types: Marijuana     Allergies   Patient has no known allergies.   Review of Systems Review of Systems  Musculoskeletal:  Positive for back pain.  Physical Exam Triage Vital Signs ED Triage Vitals  Enc Vitals Group     BP 04/09/23 1427 121/79     Pulse Rate 04/09/23 1427 94     Resp 04/09/23 1427 18     Temp 04/09/23 1427 98.4 F (36.9 C)     Temp Source 04/09/23 1427 Oral     SpO2 04/09/23 1427 97 %     Weight --      Height --      Head Circumference --      Peak Flow --      Pain Score 04/09/23 1423 8     Pain Loc --      Pain Edu? --      Excl. in GC? --    No data found.  Updated Vital Signs BP 121/79 (BP Location: Right Arm)   Pulse 94   Temp 98.4 F (36.9 C) (Oral)   Resp 18   LMP 03/30/2023   SpO2 97%   Visual Acuity Right Eye Distance:   Left Eye Distance:   Bilateral Distance:    Right Eye Near:   Left Eye Near:    Bilateral Near:     Physical Exam Vitals and nursing note reviewed.  Constitutional:      General: She is not in acute distress.    Appearance: Normal appearance. She is normal weight. She is not ill-appearing, toxic-appearing or diaphoretic.  HENT:     Head: Normocephalic and atraumatic.  Eyes:     General: No scleral  icterus.       Right eye: No discharge.        Left eye: No discharge.     Extraocular Movements: Extraocular movements intact.     Pupils: Pupils are equal, round, and reactive to light.  Cardiovascular:     Rate and Rhythm: Normal rate.  Pulmonary:     Effort: Pulmonary effort is normal. No respiratory distress.  Chest:     Chest wall: No tenderness.  Musculoskeletal:        General: No swelling.     Cervical back: Normal, normal range of motion and neck supple. No swelling, edema, deformity, signs of trauma, rigidity, spasms, tenderness or bony tenderness. No pain with movement. Normal range of motion.     Thoracic back: Tenderness and bony tenderness present. No swelling. Normal range of motion.     Lumbar back: No tenderness or bony tenderness. Normal range of motion. Scoliosis present.       Back:     Comments: Pt reports pain and tenderness to rhomboid region bilaterally with shoulder abduction or external rotation.  Pain of rhomboids reproducible to palpation.  Lymphadenopathy:     Cervical: No cervical adenopathy.  Skin:    General: Skin is warm and dry.     Coloration: Skin is not jaundiced.     Findings: No bruising, erythema or rash.  Neurological:     General: No focal deficit present.     Mental Status: She is alert and oriented to person, place, and time.     Sensory: No sensory deficit.     Motor: No weakness.     Gait: Gait normal.      UC Treatments / Results  Labs (all labs ordered are listed, but only abnormal results are displayed) Labs Reviewed - No data to display  EKG   Radiology DG Thoracic Spine 2 View  Result Date: 04/09/2023 CLINICAL DATA:  Tenderness to palpation, greatest in the upper thoracic  spine, injury EXAM: THORACIC SPINE 2 VIEWS COMPARISON:  None Available. FINDINGS: Frontal and lateral views of the thoracic spine are obtained on 4 images. There is mild right convex thoracic scoliosis centered at the T9-10 level. Otherwise alignment  is anatomic. No acute fractures. Disc spaces are well preserved. Paraspinal soft tissues are unremarkable. IMPRESSION: 1. Right convex lower thoracic scoliosis. 2. Otherwise unremarkable exam. Electronically Signed   By: Sharlet Salina M.D.   On: 04/09/2023 15:25    Procedures Procedures (including critical care time)  Medications Ordered in UC Medications - No data to display  Initial Impression / Assessment and Plan / UC Course  I have reviewed the triage vital signs and the nursing notes.  Pertinent labs & imaging results that were available during my care of the patient were reviewed by me and considered in my medical decision making (see chart for details).     Acute midline thoracic back pain - xray does not show evidence of herniated disc or spondylolisthesis. Suspect midline pain from muscular injury. Moist heat topically, NSAIDs and muscle relaxers. Strain of rhomboid - as above. Start diclofenac and skelaxin.    Final Clinical Impressions(s) / UC Diagnoses   Final diagnoses:  Acute midline thoracic back pain  Strain of rhomboid muscle, initial encounter     Discharge Instructions      Your xray shows mild scoliosis to the in part of your thoracic spine, otherwise is normal. I suspect your pain to be muscular in nature. Please purchase a microwavable heat pack and apply to the affected area of your shoulder blades and back.  Please take the diclofenac twice daily with food.  Please avoid any additional over-the-counter anti-inflammatories such as ibuprofen, naproxen, Aleve, Motrin.  Take the Skelaxin up to 3 times daily, as tolerated.  This medication may make you feel drowsy or sedated therefore do not drive a car or operate machinery if taking.  Should your symptoms persist or worsen, please return for reevaluation.     ED Prescriptions     Medication Sig Dispense Auth. Provider   diclofenac (VOLTAREN) 75 MG EC tablet Take 1 tablet (75 mg total) by mouth 2  (two) times daily with a meal for 10 days. 20 tablet Vince Ainsley L, PA   metaxalone (SKELAXIN) 800 MG tablet Take 1 tablet (800 mg total) by mouth 3 (three) times daily. 21 tablet Desere Gwin L, [place]      PDMP not reviewed this encounter.   Maretta Bees, [place] 04/09/23 1545

## 2023-04-10 ENCOUNTER — Telehealth (HOSPITAL_COMMUNITY): Payer: Self-pay

## 2023-04-10 NOTE — Telephone Encounter (Signed)
Informed by the front desk that Patient calling in for a work note correction. Patient seen yesterday, 04/09/23 and needed a 3 day work note. States her work note was only for one day.   Work note updated for three days to return to work 04/12/23 and sent to Patient mychart.

## 2023-09-26 ENCOUNTER — Encounter (HOSPITAL_COMMUNITY): Payer: Self-pay

## 2023-09-26 ENCOUNTER — Other Ambulatory Visit: Payer: Self-pay

## 2023-09-26 ENCOUNTER — Ambulatory Visit (HOSPITAL_COMMUNITY)
Admission: EM | Admit: 2023-09-26 | Discharge: 2023-09-26 | Disposition: A | Discharge status: Home / Self Care | Payer: BC Managed Care – PPO

## 2023-09-26 DIAGNOSIS — K5900 Constipation, unspecified: Secondary | ICD-10-CM

## 2023-09-26 DIAGNOSIS — K529 Noninfective gastroenteritis and colitis, unspecified: Secondary | ICD-10-CM

## 2023-09-26 LAB — POC COVID19/FLU A&B COMBO
Covid Antigen, POC: NEGATIVE
Influenza A Antigen, POC: NEGATIVE
Influenza B Antigen, POC: NEGATIVE

## 2023-09-26 LAB — POCT URINALYSIS DIP (MANUAL ENTRY)
Bilirubin, UA: NEGATIVE
Blood, UA: NEGATIVE
Glucose, UA: NEGATIVE mg/dL
Ketones, POC UA: NEGATIVE mg/dL
Leukocytes, UA: NEGATIVE
Nitrite, UA: NEGATIVE
Protein Ur, POC: NEGATIVE mg/dL
Spec Grav, UA: 1.02 (ref 1.010–1.025)
Urobilinogen, UA: 1 U/dL
pH, UA: 8.5 — AB (ref 5.0–8.0)

## 2023-09-26 LAB — POCT URINE PREGNANCY: Preg Test, Ur: NEGATIVE

## 2023-09-26 MED ORDER — ACETAMINOPHEN 325 MG PO TABS
ORAL_TABLET | ORAL | Status: AC
  Filled 2023-09-26: qty 3

## 2023-09-26 MED ORDER — ONDANSETRON 4 MG PO TBDP
4.0000 mg | ORAL_TABLET | Freq: Once | ORAL | Status: AC
  Administered 2023-09-26: 4 mg via ORAL

## 2023-09-26 MED ORDER — ONDANSETRON 4 MG PO TBDP: 4.0000 mg | ORAL_TABLET | Freq: Four times a day (QID) | ORAL | 0 refills | Status: AC | PRN

## 2023-09-26 MED ORDER — ACETAMINOPHEN 325 MG PO TABS: 975.0000 mg | ORAL_TABLET | Freq: Once | ORAL | Status: DC

## 2023-09-26 MED ORDER — ONDANSETRON 4 MG PO TBDP
ORAL_TABLET | ORAL | Status: AC
  Filled 2023-09-26: qty 1

## 2023-09-26 NOTE — ED Triage Notes (Signed)
Pt is here with chills and a headache for 2 days now and the vomiting started today. Pt has not taken any meds to relieve discomfort.

## 2023-09-26 NOTE — ED Provider Notes (Signed)
[facility]    CSN: [id number] Arrival date & time: 09/26/23  1652      History   Chief Complaint Chief Complaint  Patient presents with   Emesis   Headache   Chills    HPI Martha Valentine is a 27 y.o. female.  2 day history of headache and chills Headache is rated 8/10. She has not taken any medications for symptoms. Not having fever Today had several episodes of emesis Cannot tolerate fluids No abdominal pain or urinary symptoms  LMP 11/25  Past Medical History:  Diagnosis Date   Anemia    Breast mass 12/19/2016   Mass upper inner quad X 2 weeks    Family history of breast cancer    Family history of ovarian cancer    Family history of prostate cancer    Lupus (systemic lupus erythematosus) (HCC)     Patient Active Problem List   Diagnosis Date Noted   Hyperthyroidism 04/27/2021   Family history of breast cancer 05/25/2017   Family history of ovarian cancer 05/25/2017   Family history of prostate cancer 05/25/2017    Past Surgical History:  Procedure Laterality Date   NO PAST SURGERIES      OB History     Gravida  0   Para  0   Term  0   Preterm  0   AB  0   Living  0      SAB  0   IAB  0   Ectopic  0   Multiple  0   Live Births               Home Medications    Prior to Admission medications   Medication Sig Start Date End Date Taking? Authorizing Provider  metaxalone (SKELAXIN) 800 MG tablet Take 1 tablet (800 mg total) by mouth 3 (three) times daily. 04/09/23   Crain, Alphonzo Lemmings L, PA  valACYclovir (VALTREX) 500 MG tablet valacyclovir 500 mg tablet  TAKE 1 TABLET BY MOUTH EVERY DAY    [provider]    Family History Family History  Problem Relation Age of Onset   Breast cancer Mother 30   Other Father 84       Brain Tumor   Prostate cancer Maternal Grandfather 41       required radiation therapy   Lung cancer Maternal Aunt 65   Ovarian cancer Other 85   Ovarian cancer Other 45   Thyroid  disease Neg Hx     Social History Social History   Tobacco Use   Smoking status: Never   Smokeless tobacco: Never  Vaping Use   Vaping status: Some Days  Substance Use Topics   Alcohol use: Yes   Drug use: Not Currently    Types: Marijuana     Allergies   Patient has no known allergies.   Review of Systems Review of Systems  Gastrointestinal:  Positive for vomiting.  Neurological:  Positive for headaches.     Physical Exam Triage Vital Signs ED Triage Vitals  Encounter Vitals Group     BP 09/26/23 1731 125/83     Systolic BP Percentile --      Diastolic BP Percentile --      Pulse Rate 09/26/23 1731 (!) 107     Resp 09/26/23 1731 16     Temp 09/26/23 1731 98.2 F (36.8 C)     Temp Source 09/26/23 1731 Oral     SpO2 09/26/23 1731 98 %  Weight --      Height --      Head Circumference --      Peak Flow --      Pain Score 09/26/23 1729 8     Pain Loc --      Pain Education --      Exclude from Growth Chart --    No data found.  Updated Vital Signs BP 125/83 (BP Location: Left Arm)   Pulse (!) 107   Temp 98.2 F (36.8 C) (Oral)   Resp 16   LMP 09/07/2023 (Exact Date)   SpO2 98%   Visual Acuity Right Eye Distance:   Left Eye Distance:   Bilateral Distance:    Right Eye Near:   Left Eye Near:    Bilateral Near:     Physical Exam   UC Treatments / Results  Labs (all labs ordered are listed, but only abnormal results are displayed) Labs Reviewed  POCT URINALYSIS DIP (MANUAL ENTRY) - Abnormal; Notable for the following components:      Result Value   pH, UA 8.5 (*)    All other components within normal limits  POCT URINE PREGNANCY  POC COVID19/FLU A&B COMBO    EKG   Radiology No results found.  Procedures Procedures (including critical care time)  Medications Ordered in UC Medications - No data to display   Initial Impression / Assessment and Plan / UC Course  I have reviewed the triage vital signs and the nursing  notes.  Pertinent labs & imaging results that were available during my care of the patient were reviewed by me and considered in my medical decision making (see chart for details).  Afebrile, slightly tachy 107 Patient declines tylenol dose, reports she will take at home.  UPT negative UA unremarkable   Rapid covid/flu ***  Final Clinical Impressions(s) / UC Diagnoses   Final diagnoses:  None   Discharge Instructions   None    ED Prescriptions   None    PDMP not reviewed this encounter.

## 2023-09-26 NOTE — Discharge Instructions (Addendum)
The zofran can be used every 6 hours as needed to settle the stomach Drink LOTS of fluids! Bland diet if tolerated Tylenol for pain/fever  For bowel movements I recommend using MiraLAX.  1 capful is 1 dose.  I recommend to use twice daily for the next 2 to 3 days, then daily if needed.

## 2023-10-04 ENCOUNTER — Encounter (HOSPITAL_COMMUNITY): Payer: Self-pay

## 2023-10-04 ENCOUNTER — Ambulatory Visit (HOSPITAL_COMMUNITY)
Admission: EM | Admit: 2023-10-04 | Discharge: 2023-10-04 | Disposition: A | Discharge status: Home / Self Care | Payer: BC Managed Care – PPO | Attending: Emergency Medicine | Admitting: Emergency Medicine

## 2023-10-04 DIAGNOSIS — K5900 Constipation, unspecified: Secondary | ICD-10-CM | POA: Diagnosis not present

## 2023-10-04 DIAGNOSIS — R1084 Generalized abdominal pain: Secondary | ICD-10-CM | POA: Diagnosis not present

## 2023-10-04 MED ORDER — METOCLOPRAMIDE HCL 10 MG PO TABS: 10.0000 mg | ORAL_TABLET | Freq: Four times a day (QID) | ORAL | 0 refills | Status: AC

## 2023-10-04 NOTE — ED Provider Notes (Signed)
[facility]    CSN: [id number] Arrival date & time: 10/04/23  1531      History   Chief Complaint Chief Complaint  Patient presents with   Abdominal Pain    HPI Martha Valentine is a 27 y.o. female.   Patient presents to clinic for constipation.  She was seen about a week ago where this was starting to become an issue but she was also nauseous.  She had been taking the Zofran and this has helped greatly with her nausea, which is resolved.  She has been having intermittent gas pains and burping.  Unsure when last bowel movement was.  At last visit she was pooping hard balls of stool.  She has not really started the stool softener.  Would suffer from constipation occasionally as a child and use milk of magnesia with good relief.  She has not had any vomiting or diarrhea.  No fevers.  Reports generalized abdominal pain.   The history is provided by the patient and medical records.  Abdominal Pain   Past Medical History:  Diagnosis Date   Anemia    Breast mass 12/19/2016   Mass upper inner quad X 2 weeks    Family history of breast cancer    Family history of ovarian cancer    Family history of prostate cancer    Lupus (systemic lupus erythematosus) (HCC)     Patient Active Problem List   Diagnosis Date Noted   Hyperthyroidism 04/27/2021   Family history of breast cancer 05/25/2017   Family history of ovarian cancer 05/25/2017   Family history of prostate cancer 05/25/2017    Past Surgical History:  Procedure Laterality Date   NO PAST SURGERIES      OB History     Gravida  0   Para  0   Term  0   Preterm  0   AB  0   Living  0      SAB  0   IAB  0   Ectopic  0   Multiple  0   Live Births               Home Medications    Prior to Admission medications   Medication Sig Start Date End Date Taking? Authorizing Provider  metoCLOPramide (REGLAN) 10 MG tablet Take 1 tablet (10 mg total) by mouth every 6 (six) hours. 10/04/23   Yes Rinaldo Ratel, [place] N, FNP  ondansetron (ZOFRAN-ODT) 4 MG disintegrating tablet Take 1 tablet (4 mg total) by mouth every 6 (six) hours as needed for nausea or vomiting. 09/26/23   Rising, Lurena Joiner, PA-C    Family History Family History  Problem Relation Age of Onset   Breast cancer Mother 56   Other Father 70       Brain Tumor   Prostate cancer Maternal Grandfather 68       required radiation therapy   Lung cancer Maternal Aunt 65   Ovarian cancer Other 85   Ovarian cancer Other 45   Thyroid disease Neg Hx     Social History Social History   Tobacco Use   Smoking status: Never   Smokeless tobacco: Never  Vaping Use   Vaping status: Every Day  Substance Use Topics   Alcohol use: Yes    Comment: occasionally   Drug use: Not Currently    Types: Marijuana     Allergies   Iron dextran   Review of Systems Review of Systems  Per HPI  Physical Exam Triage Vital Signs ED Triage Vitals  Encounter Vitals Group     BP 10/04/23 1621 122/84     Systolic BP Percentile --      Diastolic BP Percentile --      Pulse Rate 10/04/23 1621 94     Resp 10/04/23 1621 16     Temp 10/04/23 1621 98.9 F (37.2 C)     Temp Source 10/04/23 1621 Oral     SpO2 10/04/23 1621 96 %     Weight 10/04/23 1620 175 lb (79.4 kg)     Height 10/04/23 1620 5\' 8"  (1.727 m)     Head Circumference --      Peak Flow --      Pain Score 10/04/23 1620 6     Pain Loc --      Pain Education --      Exclude from Growth Chart --    No data found.  Updated Vital Signs BP 122/84 (BP Location: Right Arm)   Pulse 94   Temp 98.9 F (37.2 C) (Oral)   Resp 16   Ht 5\' 8"  (1.727 m)   Wt 175 lb (79.4 kg)   LMP 09/07/2023 (Exact Date)   SpO2 96%   BMI 26.61 kg/m   Visual Acuity Right Eye Distance:   Left Eye Distance:   Bilateral Distance:    Right Eye Near:   Left Eye Near:    Bilateral Near:     Physical Exam Vitals and nursing note reviewed.  Constitutional:      Appearance: She is  well-developed.  HENT:     Head: Normocephalic and atraumatic.     Mouth/Throat:     Mouth: Mucous membranes are moist.  Eyes:     General: No scleral icterus. Cardiovascular:     Rate and Rhythm: Normal rate.  Pulmonary:     Effort: Pulmonary effort is normal. No respiratory distress.  Abdominal:     General: Abdomen is flat. Bowel sounds are normal.     Palpations: Abdomen is soft.     Tenderness: There is generalized abdominal tenderness. There is no guarding or rebound.  Skin:    General: Skin is warm and dry.  Neurological:     General: No focal deficit present.     Mental Status: She is alert and oriented to person, place, and time.  Psychiatric:        Behavior: Behavior normal.      UC Treatments / Results  Labs (all labs ordered are listed, but only abnormal results are displayed) Labs Reviewed - No data to display  EKG   Radiology No results found.  Procedures Procedures (including critical care time)  Medications Ordered in UC Medications - No data to display  Initial Impression / Assessment and Plan / UC Course  I have reviewed the triage vital signs and the nursing notes.  Pertinent labs & imaging results that were available during my care of the patient were reviewed by me and considered in my medical decision making (see chart for details).  Vitals and triage reviewed, patient is hemodynamically stable.  Abdomen is soft with active bowel sounds in all quadrants.  Generalized abdominal tenderness without rebound or guarding.  Low concern for obstruction without any emesis or severe abdominal pain and active bowel sounds.  Advised symptomatic management of constipation with increasing fiber, stool softener and laxatives as needed.  Strict emergency and follow-up precautions given.  Will change from Zofran to Reglan, as a  side effect of Zofran can be constipation.  Plan of care, follow-up care return precautions given, no questions at this time.  Work note  provided.    Final Clinical Impressions(s) / UC Diagnoses   Final diagnoses:  Generalized abdominal pain  Constipation, unspecified constipation type     Discharge Instructions      Stop taking the Zofran, as this may have added to your constipation.  If you have any nausea you can use the Reglan.  Use the milk of magnesia over-the-counter as needed.  You can also consider MiraLAX.  Seek immediate care at the nearest emergency department if you develop vomiting, no bowel movement despite aggressive measures or you develop severe abdominal pain.  For moderate to severe constipation (not having a bowel movement in more than 3 days) then try to use an enema or Miralax once daily until you have a good bowel movement.  It is not a good idea to use an enema or laxatives daily. If you find you are doing this, then please follow up with a gastroenterologist. Otherwise, a medication you could use daily to help with promoting bowel movements is docusate (Colace) 100mg . It is okay to use this 1-2 times daily as a stool softener.  Try to stay active physically including regular exercise 2-3 times a week.  Make sure you hydrate well every day with about 64 ounces of water daily (that is 2 liters).  Try to avoid carb heavy foods, dairy. This includes cutting out breads, pasta, pizza, pastries, potatoes, rice, starchy foods in general. Eat more fiber as listed below:  Salads - kale, spinach, cabbage, spring mix, arugula Fruits - avocadoes, berries (blueberries, raspberries, blackberries), apples, oranges, pomegranate, grapefruit, kiwi Vegetables - asparagus, cauliflower, broccoli, green beans, brussel sprouts, bell peppers, beets; stay away from or limit starchy vegetables like potatoes, carrots, peas Other general foods - kidney beans, egg whites, almonds, walnuts, sunflower seeds, pumpkin seeds, fat free yogurt, almond milk, flax seeds, quinoa, oats  Meat - It is better to eat lean meats and limit your  red meat including pork to once a week.  Wild caught fish, chicken breast are good options as they tend to be leaner sources of good protein. Still be mindful of the sodium labels for the meats you buy.  DO NOT EAT ANY FOODS ON THIS LIST THAT YOU ARE ALLERGIC TO. For more specific needs, I highly recommend consulting a dietician or nutritionist but this can definitely be a good starting point.      ED Prescriptions     Medication Sig Dispense Auth. Provider   metoCLOPramide (REGLAN) 10 MG tablet Take 1 tablet (10 mg total) by mouth every 6 (six) hours. 10 tablet Sharla Tankard, [place] N, FNP      PDMP not reviewed this encounter.   Becki Mccaskill, [place] N, [place] 10/04/23 1655

## 2023-10-04 NOTE — ED Triage Notes (Signed)
Patient here today with c/o abd pain, nausea, loss of appetite, and constipation X 1 week. Has not tried anything OTC.

## 2023-10-04 NOTE — Discharge Instructions (Addendum)
Stop taking the Zofran, as this may have added to your constipation.  If you have any nausea you can use the Reglan.  Use the milk of magnesia over-the-counter as needed.  You can also consider MiraLAX.  Seek immediate care at the nearest emergency department if you develop vomiting, no bowel movement despite aggressive measures or you develop severe abdominal pain.  For moderate to severe constipation (not having a bowel movement in more than 3 days) then try to use an enema or Miralax once daily until you have a good bowel movement.  It is not a good idea to use an enema or laxatives daily. If you find you are doing this, then please follow up with a gastroenterologist. Otherwise, a medication you could use daily to help with promoting bowel movements is docusate (Colace) 100mg . It is okay to use this 1-2 times daily as a stool softener.  Try to stay active physically including regular exercise 2-3 times a week.  Make sure you hydrate well every day with about 64 ounces of water daily (that is 2 liters).  Try to avoid carb heavy foods, dairy. This includes cutting out breads, pasta, pizza, pastries, potatoes, rice, starchy foods in general. Eat more fiber as listed below:  Salads - kale, spinach, cabbage, spring mix, arugula Fruits - avocadoes, berries (blueberries, raspberries, blackberries), apples, oranges, pomegranate, grapefruit, kiwi Vegetables - asparagus, cauliflower, broccoli, green beans, brussel sprouts, bell peppers, beets; stay away from or limit starchy vegetables like potatoes, carrots, peas Other general foods - kidney beans, egg whites, almonds, walnuts, sunflower seeds, pumpkin seeds, fat free yogurt, almond milk, flax seeds, quinoa, oats  Meat - It is better to eat lean meats and limit your red meat including pork to once a week.  Wild caught fish, chicken breast are good options as they tend to be leaner sources of good protein. Still be mindful of the sodium labels for the meats you  buy.  DO NOT EAT ANY FOODS ON THIS LIST THAT YOU ARE ALLERGIC TO. For more specific needs, I highly recommend consulting a dietician or nutritionist but this can definitely be a good starting point.

## 2024-01-12 ENCOUNTER — Encounter (HOSPITAL_COMMUNITY): Payer: Self-pay

## 2024-01-12 ENCOUNTER — Ambulatory Visit (HOSPITAL_COMMUNITY)
Admission: EM | Admit: 2024-01-12 | Discharge: 2024-01-12 | Disposition: A | Discharge status: Home / Self Care | Attending: Physician Assistant | Admitting: Physician Assistant

## 2024-01-12 ENCOUNTER — Ambulatory Visit (INDEPENDENT_AMBULATORY_CARE_PROVIDER_SITE_OTHER)

## 2024-01-12 DIAGNOSIS — M109 Gout, unspecified: Secondary | ICD-10-CM | POA: Diagnosis not present

## 2024-01-12 DIAGNOSIS — M25571 Pain in right ankle and joints of right foot: Secondary | ICD-10-CM | POA: Diagnosis not present

## 2024-01-12 MED ORDER — COLCHICINE 0.6 MG PO TABS: 0.6000 mg | ORAL_TABLET | Freq: Every day | ORAL | 0 refills | Status: AC

## 2024-01-12 NOTE — Discharge Instructions (Addendum)
 At this time we are still waiting on radiology to review your x-rays.  I do not see any obvious abnormalities, fracture or dislocation.  At this time I am more suspicious that you are likely having a gout attack based on your symptoms and pain level.  I have sent in a medication called colchicine for you to take once per day to help with reducing the gout flare and associated pain.  Please make sure that you are drinking plenty of water and incorporating the dietary changes that were provided in your patient education materials.  We have provided you with an ankle brace to help stabilize your ankle and provide some relief.  I recommend gentle stretches, warm or cool compress per your preference as well as Tylenol as needed for pain management. If your symptoms seem to be getting worse, you notice swelling or bruising along your ankle, or severe pain, difficulty feeling your feet or toes please return here or go to the emergency room for further evaluation management.

## 2024-01-12 NOTE — Progress Notes (Signed)
 X-ray was negative for signs of fracture, dislocation or joint effusion.  No changes to management plan at this time

## 2024-01-12 NOTE — ED Provider Notes (Signed)
 [facility]    CSN: [id number] Arrival date & time: 01/12/24  0102      History   Chief Complaint Chief Complaint  Patient presents with   Ankle Pain    HPI Martha Valentine is a 28 y.o. female.   HPI  She reports after she got off work on Sunday she was having ankle pain  She states the pain got worse yesterday and she was not able to walk on it at all by the end of the day She states she was not able to sleep last night due to pain   She denies twisting it, falls, trauma  Interventions: Ibuprofen, Tylenol   She thinks there may be a bit of swelling present but denies redness or bruising  Past Medical History:  Diagnosis Date   Anemia    Breast mass 12/19/2016   Mass upper inner quad X 2 weeks    Family history of breast cancer    Family history of ovarian cancer    Family history of prostate cancer    Lupus (systemic lupus erythematosus) (HCC)     Patient Active Problem List   Diagnosis Date Noted   Hyperthyroidism 04/27/2021   Family history of breast cancer 05/25/2017   Family history of ovarian cancer 05/25/2017   Family history of prostate cancer 05/25/2017    Past Surgical History:  Procedure Laterality Date   NO PAST SURGERIES      OB History     Gravida  0   Para  0   Term  0   Preterm  0   AB  0   Living  0      SAB  0   IAB  0   Ectopic  0   Multiple  0   Live Births               Home Medications    Prior to Admission medications   Medication Sig Start Date End Date Taking? Authorizing Provider  colchicine 0.6 MG tablet Take 1 tablet (0.6 mg total) by mouth daily. 01/12/24  Yes Ervin Hensley E, PA-C  metoCLOPramide (REGLAN) 10 MG tablet Take 1 tablet (10 mg total) by mouth every 6 (six) hours. 10/04/23   Garrison, [place] N, FNP  ondansetron (ZOFRAN-ODT) 4 MG disintegrating tablet Take 1 tablet (4 mg total) by mouth every 6 (six) hours as needed for nausea or vomiting. 09/26/23   Rising, Lurena Joiner, PA-C     Family History Family History  Problem Relation Age of Onset   Breast cancer Mother 80   Other Father 60       Brain Tumor   Prostate cancer Maternal Grandfather 68       required radiation therapy   Lung cancer Maternal Aunt 65   Ovarian cancer Other 85   Ovarian cancer Other 45   Thyroid disease Neg Hx     Social History Social History   Tobacco Use   Smoking status: Never   Smokeless tobacco: Never  Vaping Use   Vaping status: Every Day  Substance Use Topics   Alcohol use: Yes    Comment: occasionally   Drug use: Not Currently    Types: Marijuana     Allergies   Iron dextran   Review of Systems Review of Systems  Musculoskeletal:  Positive for arthralgias and myalgias.     Physical Exam Triage Vital Signs ED Triage Vitals  Encounter Vitals Group     BP 01/12/24 0917 Marland Kitchen)  140/92     Systolic BP Percentile --      Diastolic BP Percentile --      Pulse Rate 01/12/24 0917 97     Resp 01/12/24 0917 18     Temp 01/12/24 0917 98.5 F (36.9 C)     Temp Source 01/12/24 0917 Oral     SpO2 01/12/24 0917 97 %     Weight --      Height --      Head Circumference --      Peak Flow --      Pain Score 01/12/24 0918 10     Pain Loc --      Pain Education --      Exclude from Growth Chart --    No data found.  Updated Vital Signs BP (!) 140/92 (BP Location: Right Arm)   Pulse 97   Temp 98.5 F (36.9 C) (Oral)   Resp 18   LMP 12/25/2023 (Approximate)   SpO2 97%   Visual Acuity Right Eye Distance:   Left Eye Distance:   Bilateral Distance:    Right Eye Near:   Left Eye Near:    Bilateral Near:     Physical Exam Vitals reviewed.  Constitutional:      Appearance: Normal appearance.  HENT:     Head: Normocephalic and atraumatic.  Musculoskeletal:     Right ankle: Swelling present. Tenderness present over the ATF ligament. Decreased range of motion. Anterior drawer test negative. Normal pulse.     Right Achilles Tendon: No tenderness.  Thompson's test negative.     Left ankle: Normal.     Right foot: Decreased range of motion. Tenderness present.     Comments: Patient has exquisite tenderness over the ATFL and lateral joint space of the ankle.  No obvious signs of swelling, redness, bruising.  There is mildly increased warmth to the affected area.  She is not able to dorsi or plantarflex without significant pain.  She is able to slightly flex toes but reports pain with this.  Dorsalis pedis pulses 2+ and brisk.  Neurological:     General: No focal deficit present.     Mental Status: She is alert and oriented to person, place, and time.  Psychiatric:        Mood and Affect: Mood normal.        Behavior: Behavior normal.        Thought Content: Thought content normal.        Judgment: Judgment normal.      UC Treatments / Results  Labs (all labs ordered are listed, but only abnormal results are displayed) Labs Reviewed - No data to display  EKG   Radiology No results found.  Procedures Procedures (including critical care time)  Medications Ordered in UC Medications - No data to display  Initial Impression / Assessment and Plan / UC Course  I have reviewed the triage vital signs and the nursing notes.  Pertinent labs & imaging results that were available during my care of the patient were reviewed by me and considered in my medical decision making (see chart for details).      Final Clinical Impressions(s) / UC Diagnoses   Final diagnoses:  Acute right ankle pain  Acute gout of right ankle, unspecified cause   Patient presents today with acute, unprovoked right ankle pain.  She denies known trauma, injury, twisting of the ankle but reports severe pain along the ATFL area.  Physical exam reveals overall  normal-appearing right ankle compared to left but with significantly reduced range of motion due to discomfort.  She has severe tenderness even to light palpation along the ankle joint and dorsum of the  foot.  Skin is slightly warm to the touch but does not appear swollen, erythematous, bruised.  Still awaiting radiology review of x-rays but on my initial impression I do not see any obvious signs of dislocation or fracture.  Reviewed with patient that we would contact her if radiology review indicates changes to management plan.  For now we will provide ankle brace to assist with stabilization.  Will provide colchicine as I suspect symptoms are likely secondary to a gout flare given her HPI.  Reviewed that she should stay well-hydrated and can use Tylenol as needed for further pain relief.  Gout education materials were provided in after visit summary.  Follow-up as needed or indicated by x-ray results.    Discharge Instructions      At this time we are still waiting on radiology to review your x-rays.  I do not see any obvious abnormalities, fracture or dislocation.  At this time I am more suspicious that you are likely having a gout attack based on your symptoms and pain level.  I have sent in a medication called colchicine for you to take once per day to help with reducing the gout flare and associated pain.  Please make sure that you are drinking plenty of water and incorporating the dietary changes that were provided in your patient education materials.  We have provided you with an ankle brace to help stabilize your ankle and provide some relief.  I recommend gentle stretches, warm or cool compress per your preference as well as Tylenol as needed for pain management. If your symptoms seem to be getting worse, you notice swelling or bruising along your ankle, or severe pain, difficulty feeling your feet or toes please return here or go to the emergency room for further evaluation management.     ED Prescriptions     Medication Sig Dispense Auth. Provider   colchicine 0.6 MG tablet Take 1 tablet (0.6 mg total) by mouth daily. 15 tablet [employer] E, PA-C      PDMP not reviewed this  encounter.   Nicollette Wilhelmi, Oswaldo Conroy, PA-C 01/12/24 1300

## 2024-01-12 NOTE — ED Triage Notes (Signed)
 Pt c/o rt ankle pain since Sunday night after getting off work. Denies injury. Took tylenol and ibuprofen with no relief. States needs a work note.
